# Patient Record
Sex: Female | Born: 1975
Health system: Southern US, Community
[De-identification: ages and names within clinical notes are randomized; demographics above are authoritative.]

## PROBLEM LIST (undated history)

## (undated) DIAGNOSIS — Z973 Presence of spectacles and contact lenses: Secondary | ICD-10-CM

## (undated) DIAGNOSIS — D219 Benign neoplasm of connective and other soft tissue, unspecified: Secondary | ICD-10-CM

## (undated) DIAGNOSIS — I499 Cardiac arrhythmia, unspecified: Secondary | ICD-10-CM

## (undated) DIAGNOSIS — N838 Other noninflammatory disorders of ovary, fallopian tube and broad ligament: Secondary | ICD-10-CM

## (undated) DIAGNOSIS — G43909 Migraine, unspecified, not intractable, without status migrainosus: Secondary | ICD-10-CM

## (undated) DIAGNOSIS — L309 Dermatitis, unspecified: Secondary | ICD-10-CM

## (undated) DIAGNOSIS — Z8489 Family history of other specified conditions: Secondary | ICD-10-CM

## (undated) DIAGNOSIS — N760 Acute vaginitis: Secondary | ICD-10-CM

## (undated) DIAGNOSIS — O210 Mild hyperemesis gravidarum: Secondary | ICD-10-CM

## (undated) DIAGNOSIS — R7303 Prediabetes: Secondary | ICD-10-CM

## (undated) DIAGNOSIS — B9689 Other specified bacterial agents as the cause of diseases classified elsewhere: Secondary | ICD-10-CM

## (undated) DIAGNOSIS — R102 Pelvic and perineal pain: Secondary | ICD-10-CM

## (undated) DIAGNOSIS — G43709 Chronic migraine without aura, not intractable, without status migrainosus: Secondary | ICD-10-CM

## (undated) DIAGNOSIS — T7840XA Allergy, unspecified, initial encounter: Secondary | ICD-10-CM

## (undated) DIAGNOSIS — R351 Nocturia: Secondary | ICD-10-CM

## (undated) DIAGNOSIS — G8929 Other chronic pain: Secondary | ICD-10-CM

## (undated) HISTORY — DX: Other specified bacterial agents as the cause of diseases classified elsewhere: B96.89

## (undated) HISTORY — DX: Pelvic and perineal pain: R10.2

## (undated) HISTORY — DX: Mild hyperemesis gravidarum: O21.0

## (undated) HISTORY — DX: Benign neoplasm of connective and other soft tissue, unspecified: D21.9

## (undated) HISTORY — DX: Cardiac arrhythmia, unspecified: I49.9

## (undated) HISTORY — PX: BREAST CYST EXCISION: SHX579

## (undated) HISTORY — PX: LAPAROSCOPIC ASSISTED VAGINAL HYSTERECTOMY: SHX5398

## (undated) HISTORY — PX: VAGINAL HYSTERECTOMY: SUR661

## (undated) HISTORY — PX: DIAGNOSTIC LAPAROSCOPY: SUR761

## (undated) HISTORY — DX: Acute vaginitis: N76.0

## (undated) HISTORY — DX: Other noninflammatory disorders of ovary, fallopian tube and broad ligament: N83.8

## (undated) HISTORY — PX: TUBAL LIGATION: SHX77

## (undated) HISTORY — DX: Chronic migraine without aura, not intractable, without status migrainosus: G43.709

## (undated) HISTORY — PX: LAPAROSCOPY: SHX197

## (undated) HISTORY — DX: Other chronic pain: G89.29

## (undated) HISTORY — DX: Allergy, unspecified, initial encounter: T78.40XA

---

## 1998-05-16 ENCOUNTER — Emergency Department (HOSPITAL_COMMUNITY): Admission: EM | Admit: 1998-05-16 | Discharge: 1998-05-16 | Payer: Self-pay | Admitting: Emergency Medicine

## 1998-08-24 ENCOUNTER — Observation Stay (HOSPITAL_COMMUNITY): Admission: AD | Admit: 1998-08-24 | Discharge: 1998-08-24 | Payer: Self-pay | Admitting: Obstetrics and Gynecology

## 1998-08-31 ENCOUNTER — Inpatient Hospital Stay (HOSPITAL_COMMUNITY): Admission: AD | Admit: 1998-08-31 | Discharge: 1998-09-01 | Payer: Self-pay | Admitting: Obstetrics and Gynecology

## 1998-09-10 ENCOUNTER — Inpatient Hospital Stay (HOSPITAL_COMMUNITY): Admission: AD | Admit: 1998-09-10 | Discharge: 1998-09-10 | Payer: Self-pay | Admitting: Obstetrics and Gynecology

## 1998-09-15 ENCOUNTER — Inpatient Hospital Stay (HOSPITAL_COMMUNITY): Admission: AD | Admit: 1998-09-15 | Discharge: 1998-09-15 | Payer: Self-pay | Admitting: Obstetrics and Gynecology

## 1998-09-17 ENCOUNTER — Encounter: Payer: Self-pay | Admitting: Obstetrics and Gynecology

## 1998-09-17 ENCOUNTER — Observation Stay (HOSPITAL_COMMUNITY): Admission: AD | Admit: 1998-09-17 | Discharge: 1998-09-18 | Payer: Self-pay | Admitting: Obstetrics and Gynecology

## 1998-09-21 ENCOUNTER — Emergency Department (HOSPITAL_COMMUNITY): Admission: EM | Admit: 1998-09-21 | Discharge: 1998-09-21 | Payer: Self-pay | Admitting: Emergency Medicine

## 1998-09-21 ENCOUNTER — Encounter: Payer: Self-pay | Admitting: Emergency Medicine

## 1999-02-07 ENCOUNTER — Inpatient Hospital Stay (HOSPITAL_COMMUNITY): Admission: AD | Admit: 1999-02-07 | Discharge: 1999-02-07 | Payer: Self-pay | Admitting: Obstetrics & Gynecology

## 1999-02-09 ENCOUNTER — Observation Stay (HOSPITAL_COMMUNITY): Admission: AD | Admit: 1999-02-09 | Discharge: 1999-02-10 | Payer: Self-pay | Admitting: Obstetrics and Gynecology

## 1999-02-10 ENCOUNTER — Encounter: Payer: Self-pay | Admitting: Obstetrics & Gynecology

## 1999-03-09 ENCOUNTER — Inpatient Hospital Stay (HOSPITAL_COMMUNITY): Admission: AD | Admit: 1999-03-09 | Discharge: 1999-03-09 | Payer: Self-pay | Admitting: *Deleted

## 1999-03-16 ENCOUNTER — Inpatient Hospital Stay (HOSPITAL_COMMUNITY): Admission: AD | Admit: 1999-03-16 | Discharge: 1999-03-16 | Payer: Self-pay | Admitting: Obstetrics and Gynecology

## 1999-03-17 ENCOUNTER — Observation Stay (HOSPITAL_COMMUNITY): Admission: AD | Admit: 1999-03-17 | Discharge: 1999-03-17 | Payer: Self-pay | Admitting: *Deleted

## 1999-03-20 ENCOUNTER — Inpatient Hospital Stay (HOSPITAL_COMMUNITY): Admission: AD | Admit: 1999-03-20 | Discharge: 1999-03-23 | Payer: Self-pay | Admitting: Obstetrics & Gynecology

## 2000-09-07 ENCOUNTER — Other Ambulatory Visit: Admission: RE | Admit: 2000-09-07 | Discharge: 2000-09-07 | Payer: Self-pay | Admitting: *Deleted

## 2000-12-04 ENCOUNTER — Encounter: Payer: Self-pay | Admitting: Family Medicine

## 2000-12-04 ENCOUNTER — Ambulatory Visit (HOSPITAL_COMMUNITY): Admission: RE | Admit: 2000-12-04 | Discharge: 2000-12-04 | Payer: Self-pay | Admitting: Family Medicine

## 2001-09-19 ENCOUNTER — Other Ambulatory Visit: Admission: RE | Admit: 2001-09-19 | Discharge: 2001-09-19 | Payer: Self-pay | Admitting: *Deleted

## 2002-09-23 ENCOUNTER — Other Ambulatory Visit: Admission: RE | Admit: 2002-09-23 | Discharge: 2002-09-23 | Payer: Self-pay | Admitting: *Deleted

## 2003-09-22 ENCOUNTER — Other Ambulatory Visit: Admission: RE | Admit: 2003-09-22 | Discharge: 2003-09-22 | Payer: Self-pay | Admitting: *Deleted

## 2003-11-04 ENCOUNTER — Encounter (INDEPENDENT_AMBULATORY_CARE_PROVIDER_SITE_OTHER): Payer: Self-pay

## 2003-11-04 ENCOUNTER — Observation Stay (HOSPITAL_COMMUNITY): Admission: RE | Admit: 2003-11-04 | Discharge: 2003-11-05 | Payer: Self-pay | Admitting: Obstetrics and Gynecology

## 2004-03-17 ENCOUNTER — Inpatient Hospital Stay (HOSPITAL_COMMUNITY): Admission: AD | Admit: 2004-03-17 | Discharge: 2004-03-17 | Payer: Self-pay | Admitting: Obstetrics and Gynecology

## 2004-03-17 ENCOUNTER — Ambulatory Visit (HOSPITAL_COMMUNITY): Admission: RE | Admit: 2004-03-17 | Discharge: 2004-03-17 | Payer: Self-pay | Admitting: Obstetrics and Gynecology

## 2004-03-21 ENCOUNTER — Ambulatory Visit (HOSPITAL_COMMUNITY): Admission: RE | Admit: 2004-03-21 | Discharge: 2004-03-21 | Payer: Self-pay | Admitting: Obstetrics and Gynecology

## 2004-09-06 ENCOUNTER — Inpatient Hospital Stay (HOSPITAL_COMMUNITY): Admission: AD | Admit: 2004-09-06 | Discharge: 2004-09-06 | Payer: Self-pay | Admitting: Obstetrics and Gynecology

## 2004-09-15 ENCOUNTER — Other Ambulatory Visit: Admission: RE | Admit: 2004-09-15 | Discharge: 2004-09-15 | Payer: Self-pay | Admitting: Obstetrics and Gynecology

## 2005-01-31 ENCOUNTER — Ambulatory Visit (HOSPITAL_COMMUNITY): Admission: RE | Admit: 2005-01-31 | Discharge: 2005-01-31 | Payer: Self-pay | Admitting: Family Medicine

## 2005-02-01 ENCOUNTER — Ambulatory Visit (HOSPITAL_COMMUNITY): Admission: RE | Admit: 2005-02-01 | Discharge: 2005-02-01 | Payer: Self-pay | Admitting: Obstetrics and Gynecology

## 2005-05-09 ENCOUNTER — Ambulatory Visit (HOSPITAL_COMMUNITY): Admission: RE | Admit: 2005-05-09 | Discharge: 2005-05-09 | Payer: Self-pay | Admitting: Obstetrics and Gynecology

## 2005-05-09 ENCOUNTER — Encounter (INDEPENDENT_AMBULATORY_CARE_PROVIDER_SITE_OTHER): Payer: Self-pay | Admitting: *Deleted

## 2005-12-05 ENCOUNTER — Other Ambulatory Visit: Admission: RE | Admit: 2005-12-05 | Discharge: 2005-12-05 | Payer: Self-pay | Admitting: Obstetrics and Gynecology

## 2009-04-29 ENCOUNTER — Encounter: Admission: RE | Admit: 2009-04-29 | Discharge: 2009-04-29 | Payer: Self-pay | Admitting: Obstetrics and Gynecology

## 2009-05-26 ENCOUNTER — Encounter: Admission: RE | Admit: 2009-05-26 | Discharge: 2009-05-26 | Payer: Self-pay | Admitting: Family Medicine

## 2010-05-25 ENCOUNTER — Encounter: Admission: RE | Admit: 2010-05-25 | Discharge: 2010-05-25 | Payer: Self-pay | Admitting: Obstetrics and Gynecology

## 2011-03-19 ENCOUNTER — Inpatient Hospital Stay (INDEPENDENT_AMBULATORY_CARE_PROVIDER_SITE_OTHER)
Admission: RE | Admit: 2011-03-19 | Discharge: 2011-03-19 | Disposition: A | Discharge status: Home / Self Care | Payer: 59 | Source: Ambulatory Visit | Attending: Family Medicine | Admitting: Family Medicine

## 2011-03-19 DIAGNOSIS — T7840XA Allergy, unspecified, initial encounter: Secondary | ICD-10-CM

## 2011-03-31 NOTE — Op Note (Signed)
NAMEONDREA, DOW              ACCOUNT NO.:  [id number]   MEDICAL RECORD NO.:  [id number]          PATIENT TYPE:  AMB   LOCATION:  SDC                           [facility]:  [facility]   PHYSICIAN:  Janine Limbo, M.D.DATE OF BIRTH:  [birth year]   DATE OF PROCEDURE:  05/09/2005  DATE OF DISCHARGE:                                 OPERATIVE REPORT   PREOPERATIVE DIAGNOSES:  1.  Chronic pelvic pain.  2.  Right lower quadrant pain.   POSTOPERATIVE DIAGNOSES:  1.  Chronic pelvic pain.  2.  Right lower quadrant pain.  3.  The pelvic adhesions.  4.  Bilateral hydrosalpinges.  5.  Endometriosis.   PROCEDURES:  1.  Diagnostic laparoscopy.  2.  Laparoscopic lysis of adhesions.  3.  Laparoscopic pelvic biopsies.  4.  Laparoscopic bilateral salpingectomies.  5.  Laparoscopic suspension of the right ovary.   SURGEON:  Janine Limbo, M.D.   FIRST ASSISTANT:  None.   ANESTHETIC:  general.   DISPOSITION:  Ms. Montelongo is a 35 year old female, para 2-0-0-2, who  presents with the above-mentioned diagnoses.  The patient has had a laparoscopic vaginal hysterectomy as well as a  diagnostic laparoscopy.  She continues to have discomfort.  She has been  seen by a pelvic pain specialist from the [place] [facility] [place] at  [facility], and they have recommended that we suspend her right ovary.  The  patient specifically complains of right lower quadrant pain.  An ultrasound  of her pelvis is normal except for what appears to be bilateral  hydrosalpinges.  The patient understands the indications for her diagnostic  laparoscopy.  She understands that there is no guarantee that we will be  able to eliminate all of her pain.  The patient has asked that if we find  dense adhesions on her right ovary that we proceed with right oophorectomy.  Otherwise, she would like to keep her right ovary.  The patient accepts the  risks of, but not limited to, anesthetic complications, bleeding,  infections, and possible damage to the surrounding organs.   FINDINGS:  The uterus was surgically absent.  The right ovary appeared to be  normal.  It was present in the posterior cul-de-sac to the right.  The left  ovary appeared normal except for some adhesions between the left pelvic  sidewall and the left ovary.  Both ovaries contained hydrosalpinges.  There  were adhesions present between the left fallopian tube, the left ovary, and  the left pelvic sidewall.  There was a hyperpigmented area in the left  anterior cul-de-sac that measured approximately 0.3 cm in size, and this was  consistent with endometriosis.  There were planned bland-appearing,  thickened areas where the round ligaments should be bilaterally.  This was  thought to represent scar tissue.  The appendix, the liver, and the  remainder of the bowel appeared normal.   PROCEDURE:  The patient was taken to the operating room, where a general  anesthetic was given.  The patient's abdomen, perineum, and vagina were  prepped with multiple layers of Betadine.  A Foley  catheter was placed in  the bladder.  Examination under anesthesia was performed.  A padded sponge  stick was placed in the vagina.  The patient was then sterilely draped.  The  subumbilical area was injected with 5 mL of 0.5% Marcaine with epinephrine.  An incision was made and we removed the old scar from her subumbilical area.  The incision was then extended through the subcutaneous tissue, the fascia,  and the anterior peritoneum.  Care was taken not to damage any of the vital  underlying structures.  The Hassan cannula was sutured into place.  A  pneumoperitoneum was obtained.  The pelvis was inspected with findings as  mentioned above.  Two suprapubic areas were injected with a total of 5 mL of  0.5% Marcaine with epinephrine.  Two 5 mm trocars were placed in the lower  abdomen under direct visualization.  Pictures were then taken of the  patient's pelvis  and abdominal structures.  We injected the hyperpigmented  area and the pelvis with 5 mL of 0.5% Marcaine with epinephrine.  The tan-  appearing area present at both round ligaments was then injected with 2 mL  of 0.5% Marcaine with epinephrine.  Biopsies were obtained of both of these areas.  Hemostasis was achieved  using the bipolar cautery.  Care was taken not to damage any of the vital underlying structures.  We  then isolated the hydrosalpinx on the right fallopian tube.  Using the  bipolar cautery, we cauterized the mesosalpinx.  The hydrosalpinx and the  remainder of the fallopian tube was then sharply excised.  Hemostasis was  adequate.  We placed a 5-0 suture of silk into the abdomen, and we attempted  to suspend the right ovary from the right pelvic sidewall using a 5-0 silk  suture.  This was not successful.  We then used two 2-0 Vicryl Endoloops to  suspend the right ovary to the right pelvic sidewall, and we did incorporate  the silk suture.  Pictures were taken.  Hemostasis was adequate.  The pelvis  was vigorously irrigated.  We then lysed the adhesions between the left  fallopian tube and the left pelvic sidewall.  This isolated the left  hydrosalpinx.  We then cauterized the base of the mesosalpinx using the  bipolar cautery.  The left fallopian tube was then sharply excised.  Hemostasis was noted to be adequate throughout.  The pelvis was vigorously  irrigated.  Again care was taken not to damage any of the vital underlying  structures.  At this point we felt that we should terminate our procedure.  The pneumoperitoneum was allowed to escape.  The trocars were removed under  direct visualization.  Again hemostasis was adequate.  The subumbilical  incision was closed using figure-of-eight sutures of 2-0 Vicryl.  The  subcutaneous layer was closed using interrupted sutures of 2-0 Vicryl.  All  skin incisions were closed using subcuticular sutures of 4-0 Monocryl. Sponge,  needle, and instrument counts were correct on two occasions.  The estimated blood loss for the procedure was 10 mL.  The patient tolerated  her procedure well.  The patient was awakened from her anesthetic and taken  to the recovery room in stable condition.  Sponge, needle, and instrument  counts were noted to be correct.  The patient was noted to drain clear  yellow urine.   FOLLOW-UP INSTRUCTIONS:  The patient was given a prescription for Vicodin,  and she will take one or two tablets every four  hours as needed for pain.  She will return to see Dr. Stefano Gaul in two to three weeks for follow-up  examination.  She was given a copy of the postoperative instruction sheet as prepared by  the [facility] for patients who have undergone a  diagnostic laparoscopy.       AVS/MEDQ  D:  05/09/2005  T:  05/09/2005  Job:  [id number]   cc:   [name], M.D.  [address]  [place]  [place] [address]  Fax: [phone]   Elana Alm. Nicholos Johns, M.D.  [address]  [place]  [place] [address]  Fax: [phone]   Saralyn Pilar  [facility]  [address]  [place] [address]  Fax: [phone]

## 2011-03-31 NOTE — H&P (Signed)
NAMEAINO, HECKERT              ACCOUNT NO.:  [id number]   MEDICAL RECORD NO.:  [id number]          PATIENT TYPE:  AMB   LOCATION:  SDC                           [facility]   PHYSICIAN:  Janine Limbo, M.D.DATE OF BIRTH:  10/10/1976   DATE OF ADMISSION:  05/09/2005  DATE OF DISCHARGE:                                HISTORY & PHYSICAL   HISTORY OF PRESENT ILLNESS:  Ms. Hanks is a 35 year old female, para 2-0-0-  2, who presents for diagnostic laparoscopy. The patient has a history of  chronic pelvic pain and she has pain particularly in the right lower  quadrant. The patient is status post laparoscopically assisted vaginal  hysterectomy. She is also status post diagnostic laparoscopy in May of 2005.  Operative findings including bilateral hydrosalpinges but her ovaries  appeared grossly normal. There were some adhesions present.   The patient had been seen by a urologist and he does not think that she has  a urologic etiology for her discomfort. She has been seen by Dr. Saralyn Pilar  at the [place] [facility] and he believes that she is  having pain from her right ovary. He feels that the ovary is present in the  cul-de-sac at the apex of the vagina. He feels that suspension of the right  ovary may relieve her discomfort. The patient has a past history of  Chlamydia that was appropriately treated. Her most recent Pap smear was  within normal limits. The patient denies fever and chills.   OBSTETRICAL HISTORY:  The patient had a vaginal delivery at term in 1997. In  2000, she had a vaginal delivery at term.   PAST MEDICAL HISTORY:  The patient has a past history of migraine headaches.  She denies hypertension and diabetes.   ALLERGIES:  No known drug allergies.   SOCIAL HISTORY:  The patient is married and she denies cigarette use,  alcohol use, and recreational drug use.   REVIEW OF SYMPTOMS:  Noncontributory.   FAMILY HISTORY:  The patient's  mother had breast cancer at age 7. Her  maternal aunts had hypertension.   PHYSICAL EXAMINATION:  VITAL SIGNS:  Weight is 145 pounds. Height is 5 feet  9 inches.  HEENT:  Within normal limits.  CHEST:  Clear.  HEART:  Regular rate and rhythm.  BREASTS:  Without masses.  ABDOMEN:  Nontender.  EXTREMITIES:  Within normal limits.  NEUROLOGICAL:  Grossly normal.  PELVIC:  External genitalia is normal. The vagina is normal. The cervix is  absent. The uterus is absent.  ADNEXA:  No masses are appreciated. There is tenderness on the right.  Rectovaginal exam confirms.   ASSESSMENT:  1.  Right lower quadrant pain.  2.  Chronic pelvic pain.   PLAN:  The patient will undergo a diagnostic laparoscopy. The patient has  said that if we are relatively assured that her pain will be resolved with  suspension of her right ovary then she would like for [place] to proceed with  that. On the other hand, if it appears that oophorectomy on the right will  more than likely relieve her discomfort, then she does say that we have  permission to proceed with right salpingo-oophorectomy. The patient  understands the indications for her procedure and she accepts the risks of,  but not limited to anesthetic complications, bleeding, infections, and  possible damage to the surrounding organs.       AVS/MEDQ  D:  05/08/2005  T:  05/08/2005  Job:  [id number]   cc:   [name], M.D.  [address]  [place]  [place] [address]  Fax: [phone]   Elana Alm. Nicholos Johns, M.D.  [address]  [place]  [place] [address]  Fax: [phone]

## 2011-03-31 NOTE — Op Note (Signed)
NAMESCOTTLYN, Rhonda [name]                        ACCOUNT NO.:  [id number]   MEDICAL RECORD NO.:  [id number]                   PATIENT TYPE:  AMB   LOCATION:  SDC                                  [facility]   PHYSICIAN:  Janine Limbo, M.D.            DATE OF BIRTH:  04/09/1976   DATE OF PROCEDURE:  03/21/2004  DATE OF DISCHARGE:                                 OPERATIVE REPORT   PREOPERATIVE DIAGNOSIS:  Pelvic pain.   POSTOPERATIVE DIAGNOSES:  1. Pelvic pain.  2. Pelvic adhesions.   PROCEDURES:  1. Diagnostic laparoscopy.  2. Laparoscopic lysis of adhesions.   SURGEON:  Janine Limbo, M.D.   ANESTHESIA:  General anesthesia.   INDICATIONS FOR PROCEDURE:  Rhonda [name] is a 35 year old female, para 2-0-0-  2, who presents for diagnostic laparoscopy because of ongoing pelvic pain.  The patient had a laparoscopically assisted vaginal hysterectomy in December  of 2004.  She has also had a prior diagnostic laparoscopy.  The patient had  an ultrasound performed recently that showed a fluid filled structure in the  right pelvis consistent with a hydrosalpinx.  The patient's pain is mainly  on the left on this occasion.  The patient understands the indications for  her procedure and she accepts the risks of, but not limited to, anesthetic  complications, bleeding, infections, and possible damage to surrounding  organs.  The patient has elected not to have her left ovary removed unless  absolutely necessary.   FINDINGS:  The uterus was surgically absent.  There were dense adhesions  between the large bowel and the apex of the vaginal cuff.  There were  moderate adhesions between the small-bowel and the left sidewall just above  the left ovary.  Both fallopian tubes were slightly edematous.  The ovaries  appeared normal bilaterally.  The appendix appeared normal.  The upper  abdomen appeared normal.  There was no evidence of endometriosis in the  pelvis.   DESCRIPTION OF  PROCEDURE:  The patient was taken to the operating room where  a general anesthetic was given.  The patient's abdomen, perineum, and vagina  were prepped with multiple layers of Betadine.  Examination under anesthesia  was performed.  A Foley catheter was placed in the bladder.  A padded sponge  stick was placed in the vagina.  The patient was then sterilely draped.  The  subumbilical area was injected with 5 mL of 0.5% Marcaine with epinephrine.  An incision was made in the subumbilical area removing the old scar.  The  incision was extended sharply through the subcutaneous tissue, the fascia,  and the anterior peritoneum.  The Hasson cannula was sutured into place.  The pelvis was visualized and then a pneumoperitoneum was obtained.  The  pelvis was carefully inspected.  Two areas in the suprapubic region were  injected with a total of 4 mL of 0.5% Marcaine with epinephrine.  Two small  incisions were made and two 5 mm trocars were placed in the lower abdomen,  both under direct visualization.  Pictures were taken of the patient's  pelvic anatomy.  Hydrodissection was then performed with the scar tissue  attached to the apex of the vagina.  We carefully dissected the bowel off  the apex of the vagina.  There was no evidence of damage to the bowel.  Hemostasis was achieved on the vaginal apex using the bipolar cautery.  The  adhesions between the bowel and the left pelvic sidewall were then sharply  removed.  Again, care was taken not to damage the bowel and also not to  damage the vital underlying structures.  Hemostasis was noted to be  adequate.  The pelvis was vigorously irrigated.  The irrigation fluid was  aspirated.  Again, the bowel was carefully inspected and there was no  evidence of damage from our dissection and there was no evidence of trocar  damage.  Again, pictures were taken of patient's pelvic anatomy.  At this  point, we felt that our procedure was complete.  The patient  was given 30 mg  of IV Toradol.  The pneumoperitoneum was allowed to escape.  All instruments  were removed.  The subumbilical incision was closed using a running suture  of 0 Vicryl.  The subcutaneous layer was closed using 0 Vicryl.  The skin  was reapproximated using a subcuticular suture of 4-0 Vicryl.  The two  suprapubic incisions were closed using 4-0 Vicryl.  Sponge, needle and  instrument counts were correct.   ESTIMATED BLOOD LOSS:  10 mL.   The patient tolerated the procedure well.  She was awakened from her  anesthetic and taken to the recovery room in stable condition.  The patient  was noted to drain clear, yellow urine.   FOLLOW UP INSTRUCTIONS:  The patient was given a prescription for Vicodin  and she will take one or two tablets every four hours as needed for pain.  She will return to see Dr. Stefano Gaul in two to three weeks for follow-up  examination.  She was given a copy of the postoperative instruction sheet as  prepared by the [facility] for patients who have  undergone laparoscopy.  The patient knows that she should call for questions  or concerns.  She was given a note to return to work on Mar 28, 2004.                                               Janine Limbo, M.D.    AVS/MEDQ  D:  03/21/2004  T:  03/21/2004  Job:  [id number]

## 2011-03-31 NOTE — Discharge Summary (Signed)
NAMEHEYLEE, TANT                        ACCOUNT NO.:  [id number]   MEDICAL RECORD NO.:  [id number]                   PATIENT TYPE:  OBV   LOCATION:  9105                                 [facility]ILITY[facility]   PHYSICIAN:  Janine Limbo, M.D.            DATE OF BIRTH:  May 05, 1976   DATE OF ADMISSION:  11/04/2003  DATE OF DISCHARGE:  11/05/2003                                 DISCHARGE SUMMARY   DISCHARGE DIAGNOSES:  1. Dysmenorrhea.  2. Menorrhagia.  3. Dyspareunia.  4. Fibroid uterus.  5. Anemia.  6. Cystic bladder mass.   OPERATION:  On the date of admission, the patient underwent a  laparoscopically-assisted vaginal hysterectomy, along with a cystogram,  hydatid cystectomy, along with peritoneal and left ovarian biopsy,  tolerating all procedures well.  The patient was found to have an upper  limits of normal size uterus with normal appearing right tube and left tube,  both of which have been previously ligated; normal-appearing right ovary and  left ovary, with a questionable redundant __________ ovarian tissue which  was biopsied.  The patient had a peritoneal window in the posterior cul-de-  sac, and a left hydatid Morgagni.  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   HISTORY OF PRESENT ILLNESS:  Ms. Granato is a 35 year old female, para 2-0-0-  2, who presents for a laparoscopically-assisted vaginal hysterectomy because  of dysmenorrhea and menorrhagia.  Please see patient's dictated history and  physical examination for details.   PREOPERATIVE PHYSICAL EXAMINATION:  VITAL SIGNS:  Weight 144 pounds.  GENERAL:  Within normal limits.  PELVIC:  External genitalia is normal.  Vaginal is normal.  Cervix is  nontender.  The uterus is upper limits of normal size.  Adnexa without  masses.  RECTOVAGINAL EXAM:  Confirms.   HOSPITAL COURSE:  On the date of admission, the patient underwent the  aforementioned procedures, tolerating them all  well.  The postoperative  course was unremarkable, with the patient resuming bowel and bladder  function by postoperative day #1, and therefore deemed ready for discharge  home.  Postoperative hemoglobin was 11.6 (preoperative hemoglobin 11.9).   DISCHARGE MEDICATIONS:  1. Vicodin 1-2 tablets q.4-6h. as needed for pain.  2. Ibuprofen 600 mg one tablet with food q.6h. for 5 days, then as needed     for pain.  3. Phenergan 25 mg one tablet q.6h. for nausea.  4. Colace 100 mg one tablet twice daily until bowel movements are regular.   FOLLOW UP:  The patient is scheduled for a 6 week postoperative exam with  Dr. Stefano Gaul on December 16, 2003 at 1:30 p.m.   DISCHARGE INSTRUCTIONS:  1. The patient was given a copy of [facility] postoperative     instruction sheet.  2. She was further advised to avoid driving for 2 weeks, heavy lifting for 4     weeks, and intercourse  for 6 weeks.   DIET:  Without restriction.   FINAL PATHOLOGY:  1. Fallopian tube - hydatid cyst of Morgagni:  Benign paratubal type cyst.  2. Ovary - biopsy/wedge resection, left:  Benign ovarian cortical stroma     with no pathologic abnormalities identified.  3. Peritoneum, biopsy:  Fibrous adhesions.  4. Uterus and cervix:  Cervix with no pathologic abnormalities identified.     Benign proliferative endometrium.  Benign 0.6 cm leiomyoma.     [place] J. Adline Peals.                    Janine Limbo, M.D.    EJP/MEDQ  D:  11/26/2003  T:  11/26/2003  Job:  [id number]

## 2011-03-31 NOTE — H&P (Signed)
NAMESAMERA, Rhonda [name]                        ACCOUNT NO.:  [id number]   MEDICAL RECORD NO.:  [id number]                   PATIENT TYPE:  OBV   LOCATION:  NA                                   [facility]   PHYSICIAN:  Janine Limbo, M.D.            DATE OF BIRTH:  03/19/1976   DATE OF ADMISSION:  11/04/2003  DATE OF DISCHARGE:                                HISTORY & PHYSICAL   HISTORY OF PRESENT ILLNESS:  The patient is [name] 35 year old female, para 2-0-0-  2, who presents for [name] laparoscopically-assisted vaginal hysterectomy.  The  patient has been seen at the [facility] and [facility] division of  [facility].  She complains of dysmenorrhea and  menorrhagia.  She has had [name] D&C in the past with benign endometrium found.  Her most recent Pap smear was within normal limits.  Her GC and Chlamydia  cultures were negative.  Oral contraceptives and pain medications have not  relieved her discomfort.  She is status post tubal ligation.  She wishes to  proceed with definitive therapy at this time.  She was treated with  antibiotics and this also did not relieve her discomfort.  An ultrasound  showed fibroids.  The patient does have [name] past history of Chlamydia that was  appropriately treated.   PAST OBSTETRICAL HISTORY:  In 1997, the patient had [name] vaginal delivery at  term of [name] 5 pound 11 ounce female infant.  In 2000, the patient had [name]  vaginal delivery of [name] 6 pound 6 ounce female infant at term.  She also had [name]  postpartum tubal ligation.   PAST MEDICAL HISTORY:  The patient has [name] past history of migraine headaches.  She denies hypertension and diabetes.  She reports that she is not using any  medications currently except for pain medicines.   ALLERGIES:  No known drug allergies.   SOCIAL HISTORY:  The patient denies cigarette use, alcohol use, and  recreational drug use.   REVIEW OF SYSTEMS:  The patient complains of dyspareunia.   FAMILY HISTORY:  The  patient's mother had breast cancer at age 65.   PHYSICAL EXAMINATION:  VITAL SIGNS:  Weight 144 pounds.  HEENT:  Within normal limits.  CHEST:  Clear.  HEART:  Regular rate and rhythm.  BREASTS:  Without masses.  ABDOMEN:  Nontender.  EXTREMITIES:  Within normal limits.  NEUROLOGY:  Grossly normal.  PELVIC:  External genitalia is normal.  The vagina is normal.  The cervix is  nontender.  The uterus is upper limits of normal size.  Adnexa no masses.  RECTOVAGINAL:  Confirms.   ASSESSMENT:  1. Dysmenorrhea.  2. Menorrhagia.  3. Dyspareunia.  4. Probable fibroids.  5. Past history of Chlamydia.   PLAN:  The patient will undergo [name] laparoscopically-assisted vaginal  hysterectomy.  She understands the indications for her procedure and she  accepts the risks of,  but not limited to, anesthetic complications,  bleeding, infections, and possible damage to the surrounding organs.                                               Janine Limbo, M.D.    AVS/MEDQ  D:  11/03/2003  T:  11/03/2003  Job:  [id number]

## 2011-03-31 NOTE — Op Note (Signed)
Rhonda [name], Rhonda [name]                        ACCOUNT NO.:  [id number]   MEDICAL RECORD NO.:  [id number]                   PATIENT TYPE:  OBV   LOCATION:  9199                                 FACILITY:  [facility]   PHYSICIAN:  Janine Limbo, M.D.            DATE OF BIRTH:  Jun 27, 1976   DATE OF PROCEDURE:  11/04/2003  DATE OF DISCHARGE:                                 OPERATIVE REPORT   PREOPERATIVE DIAGNOSES:  1. Dysmenorrhea.  2. Menorrhagia.  3. Dyspareunia.  4. Anemia (hemoglobin 11.9).  5. Probable fibroid.   POSTOPERATIVE DIAGNOSES:  1. Dysmenorrhea.  2. Menorrhagia.  3. Dyspareunia.  4. Anemia (hemoglobin 11.9).  5. Probable fibroid.  6. Cystic bladder mass, rule out urachal cyst.  7. Peritoneal window, rule out endometriosis.  8. Hydatid cyst of Morgagni.   PROCEDURES:  1. Laparoscopically-assisted vaginal hysterectomy.  2. Cystogram.  3. Laparoscopic pelvic biopsies, including removal of hydatid cyst.   SURGEON:  Janine Limbo, M.D.   FIRST ASSISTANT:  Elmira J. Adline Peals.   ANESTHESIA:  General.   DISPOSITION:  Rhonda [name] is a 35 year old female, para 2-0-0-2, who  presents with the above-mentioned diagnoses.  She understands the  indications for her procedure and she accepts the risks of, but not limited  to, anesthetic complications, bleeding, infections, and possible damage to  the surrounding organs.   FINDINGS:  The uterus was upper limits normal size.  There was a normal  appearance to the fallopian tubes except for the defects from the prior  tubal ligation.  The ovaries appeared normal except that on the left there  was a 0.5 cm protrusion on the left ovary.  Biopsies were obtained.  There  was a 1 cm hydatid cyst that had detached from the fallopian tube on the  left and was attached to the left posterior peritoneum.  There was a 1-2 cm  cystic mass present anterior to the uterus and adhered to the anterior  uterus and adhered to the  posterior bladder.  Rhonda [name], M.D.  (urologist) was consulted and a urachal cyst was questioned.  A cystogram  was performed at the end of our procedure, and there was no evidence of  extravasation from the bladder and no evidence of a defect in the bladder.  The mass was not present after the vaginal hysterectomy.  There was a  peritoneal window in the left posterior cul-de-sac.  There was no definitive  evidence of endometriosis, however.  The appendix, the liver, the bowel, and  the remainder of the pelvic structures appeared normal.   DESCRIPTION OF PROCEDURE:  The patient was taken to the operating room,  where a general anesthetic was given.  The patient's abdomen, perineum, and  vagina were prepped with multiple layers of Betadine.  A Foley catheter was  placed in the bladder.  Examination under anesthesia was performed.  The  patient was sterilely draped.  The subumbilical  area was injected with 5 mL  of 0.5% Marcaine with epinephrine.  A subumbilical incision was made and the  incision was extended through the subcutaneous tissue, the fascia, and the  anterior peritoneum.  The Hasson cannula was sutured into place.  A  pneumoperitoneum was obtained.  The laparoscope was inserted.  The pelvic  structures were visualized.  The suprapubic area was injected with 3 mL of  0.5% Marcaine with epinephrine and a 5 mm trocar was placed in the lower  abdomen under direct visualization.  Pictures were taken of the patient's  pelvic anatomy.  The mass at the anterior uterus and at the base of the  bladder was then inspected.  We consulted a urologist, and a urachal cyst  was mentioned as a possibility.  The hydatid cyst in the left posterior cul-  de-sac was then removed without difficulty.  The ureter was identified and  was felt to be away from our surgical areas.  The peritoneal window in the  left posterior cul-de-sac was injected with normal saline and a biopsy was  obtained from  the peritoneal window.  Hemostasis was achieved using the  bipolar cautery.  Care was taken not to damage any of the vital underlying  structures.  The round ligaments were cauterized bilaterally and cut.  The  bladder flap was developed anteriorly.  At this point we felt we were ready  to proceed with the vaginal portion of our procedure.  The patient's cervix  was injected with a diluted solution of Pitressin and saline.  A  circumferential incision was made around the cervix and the mucosa was  advanced anteriorly and posteriorly.  The posterior cul-de-sac and the  anterior cul-de-sac were sharply entered.  A running suture was placed along  the posterior cul-de-sac for hemostasis.  Alternating from left to right the  uterosacral ligaments, paracervical tissues, parametrial tissues, and  uterine arteries were clamped, cut, sutured, and tied securely.  The uterus  was inverted through the posterior colpotomy.  The remainder of the upper  pedicles were clamped and cut and the uterus was removed from the operative  field.  The estimated weight was less than 250 g.  The upper pedicles were  then free-tied and suture ligated.  Hemostasis was noted to be adequate.  The sutures attached to the uterosacral ligaments were brought out through  the vaginal angles and tied securely.  A McCall culdoplasty suture was  placed in the posterior cul-de-sac incorporating the uterosacral ligaments  bilaterally and the posterior peritoneum.  A final check was made for  hemostasis and again hemostasis was thought to be adequate.  The vaginal  cuff was closed using figure-of-eight sutures incorporating the anterior  vaginal mucosa, the anterior peritoneum, the posterior peritoneum, and the  posterior vaginal mucosa.  The McCall culdoplasty suture was tied securely  and the apex of the vagina was noted to elevate into the midpelvis.  The operator then changed gloves.  The pneumoperitoneum was re-established.   The  pelvis was carefully inspected and again hemostasis was confirmed.  There  was no evidence of damage to the ureters, the bowel, or any other vital  structures in the pelvis.  The cystic mass that was present earlier was no  longer visible.  The urology consultor recommended a cystogram to document  that there was no evidence of extravasation or communication with the  bladder.  The pelvis was vigorously irrigated and the irrigation fluid was  removed.  The pneumoperitoneum  was allowed to escape.  All instruments were  removed.  The fascia at the subumbilical incision was closed using figure-of-  eight sutures.  The skin was reapproximated using 3-0 Vicryl.  The  suprapubic incision was closed using 3-0 Vicryl.  Contrast dye was then  placed into the bladder through the Foley catheter.  A series of x-rays was  obtained and the bladder was noted to fill easily.  The contour of the  bladder appeared completely normal.  There was no evidence of extravasation  and no evidence of pathology to the bladder.  The contrast material was  allowed to drain.  The patient was awakened from her anesthetic without  difficulty.  She was taken to the recovery room in stable condition.  Vicryl  0 was the suture material used throughout the procedure.  The estimated  blood loss for the  procedure was 150 mL.  The estimated urine output was 350 mL.  The patient  was noted to have clear, yellow urine.  A catheterized urinalysis was  obtained during the procedure, and there was no evidence of red blood cells  or infection.                                               Janine Limbo, M.D.    AVS/MEDQ  D:  11/04/2003  T:  11/05/2003  Job:  [id number]

## 2011-03-31 NOTE — H&P (Signed)
NAME:  Rhonda [name], Rhonda [name]                        ACCOUNT NO.:  [id number]   MEDICAL RECORD NO.:  [id number]                   PATIENT TYPE:  OUT   LOCATION:  ULT                                  [facility]ILITY:  [facility]   PHYSICIAN:  Janine Limbo, M.D.            DATE OF BIRTH:  1976/01/27   DATE OF ADMISSION:  03/17/2004  DATE OF DISCHARGE:                                HISTORY & PHYSICAL   HISTORY OF PRESENT ILLNESS:  Rhonda [name] is a 35 year old female, para 2-0-0-  2, who presents to the maternities admission area at the [facility] of  [facility] complaining of left lower quadrant abdominal pain that she rates  at 9/10 and then sometimes a 10/10.  The patient reports that she is having  difficulty working and walking.  Pain medications have not relieved her  discomfort.  The patient had an ultrasound performed that showed fluid in  the right fallopian tube.  No other masses or collections were appreciated.  The patient is status post laparoscopically assisted vaginal hysterectomy  from November 04, 2003.  Operative findings at the time included an upper  limits normal size uterus and a hydatid cyst of Morgagni.  The patient was  thought to have a mass in her bladder, but followup did not show any masses.  The patient is status post tubal ligation.  The patient has a past history  of chlamydia that was appropriately treated.  The patient denies GI and GU  symptoms.  She reports that she has had fever, but denies chills.   OBSTETRICAL HISTORY:  In 1997, the patient had a vaginal delivery at term of  a 5 pound 11 ounce female infant.  In 2000, the patient had a vaginal  delivery of a 6 pound 6 ounce female infant at term.   PAST MEDICAL HISTORY:  The patient has a past history of migraine headaches.  She denies hypertension and diabetes.   DRUG ALLERGIES:  No known drug allergies.   SOCIAL HISTORY:  The patient is married and she denies cigarette use,  alcohol use, and  recreational drug use.   REVIEW OF SYSTEMS:  Noncontributory.   FAMILY HISTORY:  The patient's mother had breast cancer at age 61.  Her  maternal aunts had hypertension.   PHYSICAL EXAMINATION:  VITAL SIGNS:  The temperature is 99.1 degrees, pulse  72, respirations 18, and blood pressure 123/69.  HEENT:  Within normal limits.  CHEST:  Clear.  HEART:  Regular rate and rhythm with a grade 1/6 systolic ejection murmur.  ABDOMEN:  Soft and nontender.  No masses are appreciated.  EXTREMITIES:  Grossly normal.  NEUROLOGIC:  Grossly normal.  PELVIC:  External genitalia is normal.  The vagina is normal, although the  vagina is tender posteriorly.  The cervix is absent.  The uterus is absent.  Adnexa:  No masses are appreciated, though slightly tender on the left.   ASSESSMENT:  Severe abdominal pain of uncertain etiology.   PLAN:  The patient wants to proceed with diagnostic laparoscopy.  The  patient was given Toradol in the maternity admissions area and her pain was  not significantly relieved.  She understands that we cannot guarantee relief  of her discomfort with laparoscopy.  She further understands the risks  associated with her procedure, which include, but are not limited to  anesthetic complications, bleeding, infections, and possible damage to the  surrounding organs.  Because the patient does have tenderness posteriorly  and because there is a large amount of stool in the bowel, we will give the  patient a soap suds enema to see if her discomfort is relieved.                                               Janine Limbo, M.D.    AVS/MEDQ  D:  03/17/2004  T:  03/17/2004  Job:  [id number]

## 2012-06-11 ENCOUNTER — Ambulatory Visit: Payer: Self-pay | Admitting: Obstetrics and Gynecology

## 2012-07-24 ENCOUNTER — Encounter: Payer: Self-pay | Admitting: Obstetrics and Gynecology

## 2012-07-24 ENCOUNTER — Ambulatory Visit (INDEPENDENT_AMBULATORY_CARE_PROVIDER_SITE_OTHER): Payer: 59 | Admitting: Obstetrics and Gynecology

## 2012-07-24 VITALS — BP 112/64 | Resp 14 | Ht 68.5 in | Wt 165.0 lb

## 2012-07-24 DIAGNOSIS — Z124 Encounter for screening for malignant neoplasm of cervix: Secondary | ICD-10-CM

## 2012-07-24 DIAGNOSIS — E049 Nontoxic goiter, unspecified: Secondary | ICD-10-CM

## 2012-07-24 DIAGNOSIS — N63 Unspecified lump in unspecified breast: Secondary | ICD-10-CM

## 2012-07-24 DIAGNOSIS — N6320 Unspecified lump in the left breast, unspecified quadrant: Secondary | ICD-10-CM

## 2012-07-24 DIAGNOSIS — N6325 Unspecified lump in the left breast, overlapping quadrants: Secondary | ICD-10-CM | POA: Insufficient documentation

## 2012-07-24 LAB — THYROID PANEL WITH TSH
Free Thyroxine Index: 2.2 (ref 1.0–3.9)
T3 Uptake: 36.5 % (ref 22.5–37.0)
T4, Total: 5.9 ug/dL (ref 5.0–12.5)
TSH: 0.943 u[IU]/mL (ref 0.350–4.500)

## 2012-07-24 NOTE — Progress Notes (Signed)
The patient is not taking hormone replacement therapy The patient  is not taking a Calcium supplement. Post-menopausal bleeding:yes:    Last Pap: approximate date 2009 and was normal  Last mammogram: approximate date 2011 and was normal  Last DEXA scan :  n/a Last colonoscopy:n/a  Urinary symptoms: none Normal bowel movements: Yes Reports abuse at home: No:

## 2012-07-24 NOTE — Progress Notes (Signed)
Subjective:    Rhonda [name] is a 36 y.o. female, G2P2, who presents for an annual exam.   Patient reports:  Doing well.  May have BV.      History   Social History  . Marital Status: Single    Spouse Name: N/A    Number of Children: N/A  . Years of Education: N/A   Social History Main Topics  . Smoking status: Never Smoker   . Smokeless tobacco: Never Used  . Alcohol Use: Yes  . Drug Use: No  . Sexually Active: Yes    Birth Control/ Protection: Surgical     Hysterectomy   Other Topics Concern  . None   Social History Narrative  . None    Menstrual cycle:   LMP: No LMP recorded. Patient has had a hysterectomy.           Cycle: S/p hysterectomy  The following portions of the patient's history were reviewed and updated as appropriate: allergies, current medications, past family history, past medical history, past social history, past surgical history and problem list.  Review of Systems Pertinent items are noted in HPI. Breast:Negative for breast lump,nipple discharge or nipple retraction Gastrointestinal: Negative for abdominal pain, change in bowel habits or rectal bleeding Urinary:negative   Objective:    BP 112/64  Resp 14  Ht 5' 8.5" (1.74 m)  Wt 165 lb (74.844 kg)  BMI 24.72 kg/m2    Weight:  Wt Readings from Last 1 Encounters:  07/24/12 165 lb (74.844 kg)          BMI: Body mass index is 24.72 kg/(m^2).  General Appearance: Alert, appropriate appearance for age. No acute distress HEENT: Grossly normal Neck / Thyroid: ? Nodules on thyroid bilaterally vs general thyroid enlargement Lungs: clear to auscultation bilaterally Back: No CVA tenderness Breast Exam: Discrete mass--approx 1/2 cm mobile mass in left breast at 3 o'clock position.  Glandular tissue present bilaterally.  Cardiovascular: Regular rate and rhythm. S1, S2, no murmur Gastrointestinal: Soft, non-tender, no masses or organomegaly Pelvic Exam: Uterus and cervix absent.  Adnexal area  without masses or tenderness to palpation. Rectovaginal: normal rectal, no masses Lymphatic Exam: Non-palpable nodes in neck, clavicular, axillary, or inguinal regions  Skin: no rash or abnormalities Neurologic: Normal gait and speech, no tremor  Psychiatric: Alert and oriented, appropriate affect.   Wet Prep:positive clue cells and positive whiff test Urinalysis:not applicable UPT: Not done   Assessment:    Enlarged thyroid vs nodules  Mass in left breast BV   Plan:    [facility] with specific request for evaluation of mass in left breast Pap:  Done STD screening: declined Contraception:NA, due to hysterectomy Other:  Rx MTZ 500 mg po BID x 7 days. Check thyroid panel. May need referral back to primary, Dr. Tiburcio Pea, at [facility] for further w/u of ? Thyroid nodules.      Nyra Capes, [place]

## 2012-07-26 LAB — PAP IG W/ RFLX HPV ASCU

## 2012-07-30 ENCOUNTER — Ambulatory Visit
Admission: RE | Admit: 2012-07-30 | Discharge: 2012-07-30 | Disposition: A | Discharge status: Home / Self Care | Payer: 59 | Source: Ambulatory Visit | Attending: Obstetrics and Gynecology | Admitting: Obstetrics and Gynecology

## 2012-07-30 ENCOUNTER — Encounter: Payer: Self-pay | Admitting: Obstetrics and Gynecology

## 2012-07-30 ENCOUNTER — Other Ambulatory Visit: Payer: Self-pay | Admitting: Obstetrics and Gynecology

## 2012-07-30 ENCOUNTER — Telehealth: Payer: Self-pay | Admitting: Obstetrics and Gynecology

## 2012-07-30 NOTE — Telephone Encounter (Signed)
Tc to pt per telephone call. Told pt thyroid testings=wnl and pap smear=wnl. Pt voices understanding.

## 2012-10-01 ENCOUNTER — Ambulatory Visit (INDEPENDENT_AMBULATORY_CARE_PROVIDER_SITE_OTHER): Payer: 59 | Admitting: Family Medicine

## 2012-10-01 ENCOUNTER — Telehealth: Payer: Self-pay | Admitting: *Deleted

## 2012-10-01 VITALS — BP 126/82 | HR 78 | Temp 98.5°F | Resp 17 | Ht 69.0 in | Wt 166.0 lb

## 2012-10-01 DIAGNOSIS — H5710 Ocular pain, unspecified eye: Secondary | ICD-10-CM

## 2012-10-01 DIAGNOSIS — R51 Headache: Secondary | ICD-10-CM

## 2012-10-01 DIAGNOSIS — H5711 Ocular pain, right eye: Secondary | ICD-10-CM

## 2012-10-01 DIAGNOSIS — B029 Zoster without complications: Secondary | ICD-10-CM

## 2012-10-01 LAB — POCT CBC
Granulocyte percent: 71.1 %G (ref 37–80)
HCT, POC: 41.7 % (ref 37.7–47.9)
MCH, POC: 26.8 pg — AB (ref 27–31.2)
MCV: 88.6 fL (ref 80–97)
POC LYMPH PERCENT: 21.9 %L (ref 10–50)
RBC: 4.71 M/uL (ref 4.04–5.48)
WBC: 7.8 10*3/uL (ref 4.6–10.2)

## 2012-10-01 LAB — POCT SEDIMENTATION RATE: POCT SED RATE: 23 mm/hr — AB (ref 0–22)

## 2012-10-01 MED ORDER — VALACYCLOVIR HCL 1 G PO TABS: 1000.0000 mg | ORAL_TABLET | Freq: Three times a day (TID) | ORAL | Status: DC

## 2012-10-01 MED ORDER — GABAPENTIN 300 MG PO CAPS: ORAL_CAPSULE | ORAL | Status: DC

## 2012-10-01 MED ORDER — PREDNISONE 20 MG PO TABS: ORAL_TABLET | ORAL | Status: DC

## 2012-10-01 NOTE — Progress Notes (Signed)
Reviewed and agree.

## 2012-10-01 NOTE — Patient Instructions (Addendum)
1. Shingles rash  POCT SEDIMENTATION RATE, POCT CBC  2. Herpes zoster  valACYclovir (VALTREX) 1000 MG tablet, predniSONE (DELTASONE) 20 MG tablet, gabapentin (NEURONTIN) 300 MG capsule, Ambulatory referral to Ophthalmology  3. Right facial pain    4. Pain, eye, right  Ambulatory referral to Ophthalmology

## 2012-10-01 NOTE — Telephone Encounter (Signed)
Rhonda [name] was called to be informed of her appointment with Dr. Ward Givens tomorrow morning at 11:30 am.  Rhonda [name] stated that would be a good time for her.

## 2012-10-01 NOTE — Progress Notes (Signed)
[address]   [place], [place]  [address]   [phone]   Subjective:    Patient ID: Rhonda [name], female    DOB: 02/14/1976, 36 y.o.   MRN: [id number]  HPIThis 36 y.o. female presents for evaluation of facial and eye pain for six days.  Pain onset six days ago.  No fever but +chills.  Rash developed R periorbital region three days ago.  +tingling; +itching.  +swelling in front of ear R.  No vision changes/blurred vision.  No nasal involvement; no rash on nose.  Severe eye pain R; some tearing of R eye.  No blurred vision/diplopia.  No foreign body sensation.  Eye pain is most bothersome symptom.  No facial weakness.  Works a lot but no excessive stressors.  +chicken pox as a child.  Works at [employer]; works with public daily.  Review of Systems  Constitutional: Positive for chills and fatigue. Negative for fever and diaphoresis.  HENT: Positive for neck pain. Negative for hearing loss, ear pain, neck stiffness, tinnitus and ear discharge.   Eyes: Positive for pain and redness. Negative for photophobia, discharge, itching and visual disturbance.  Skin: Positive for rash. Negative for color change, pallor and wound.  Neurological: Positive for numbness and headaches. Negative for dizziness, tremors, seizures, syncope, facial asymmetry, speech difficulty, weakness and light-headedness.  Hematological: Positive for adenopathy.  Psychiatric/Behavioral: Negative for confusion.    Past Medical History  Diagnosis Date  . Enlarged ovary   . Chronic pelvic pain in female   . Arrhythmia   . Fibroids   . Hyperemesis arising during pregnancy   . BV (bacterial vaginosis)   . Allergy     Past Surgical History  Procedure Date  . Vaginal hysterectomy   . Laparoscopy   . Tubal ligation     Prior to Admission medications   Medication Sig Start Date End Date Taking? Authorizing Provider  cetirizine (ZYRTEC) 10 MG tablet Take 10 mg by mouth daily.   Yes Historical Provider, MD  ibuprofen  (ADVIL,MOTRIN) 400 MG tablet Take 400 mg by mouth every 6 (six) hours as needed.   Yes Historical Provider, MD  gabapentin (NEURONTIN) 300 MG capsule One tablet qhs x 3 days, then one tablet bid x 3 days, then one tablet tid for nerve pain 10/01/12   Ethelda Chick, MD  Multiple Vitamin (MULTIVITAMIN) tablet Take 1 tablet by mouth daily.    Historical Provider, MD  predniSONE (DELTASONE) 20 MG tablet Three tablets daily x 1 day, then two tablets daily x 5 days, then one tablet daily x 5 days 10/01/12   Ethelda Chick, MD  valACYclovir (VALTREX) 1000 MG tablet Take 1 tablet (1,000 mg total) by mouth 3 (three) times daily. 10/01/12   Ethelda Chick, MD    No Known Allergies  History   Social History  . Marital Status: Married    Spouse Name: N/A    Number of Children: N/A  . Years of Education: N/A   Occupational History  . Not on file.   Social History Main Topics  . Smoking status: Never Smoker   . Smokeless tobacco: Never Used  . Alcohol Use: No  . Drug Use: No  . Sexually Active: Yes    Birth Control/ Protection: Surgical     Comment: Hysterectomy   Other Topics Concern  . Not on file   Social History Narrative  . No narrative on file    Family History  Problem Relation Age  of Onset  . Cancer Maternal Grandmother     uterine  . Cancer Maternal Aunt     uterine  . Cancer Maternal Grandfather         Objective:   Physical Exam  Nursing note and vitals reviewed. Constitutional: She is oriented to person, place, and time. She appears well-developed and well-nourished. No distress.  HENT:  Head: Normocephalic and atraumatic.  Left Ear: External ear normal.  Nose: Nose normal.  Mouth/Throat: Oropharynx is clear and moist. No oropharyngeal exudate.       +PREAURICULAR R LAD.  Eyes: EOM and lids are normal. Pupils are equal, round, and reactive to light. Right eye exhibits no discharge and no exudate. No foreign body present in the right eye. Left eye exhibits no  discharge and no exudate. No foreign body present in the left eye. Right conjunctiva is injected. Right conjunctiva has no hemorrhage. Left conjunctiva is not injected. Left conjunctiva has no hemorrhage. No scleral icterus.       PROCEDURE:  FLUORESCEIN APPLIED TO R EYE; NO UPTAKE.  IMMEDIATE RELIEF OF EYE PAIN WITH ANESTHETIC TOPICAL EYE DROP/OPTHANE.  Neck: Normal range of motion. Neck supple. No thyromegaly present.  Cardiovascular: Normal rate, regular rhythm and normal heart sounds.   Pulmonary/Chest: Effort normal and breath sounds normal. No respiratory distress. She has no wheezes. She has no rales.  Musculoskeletal:       Cervical back: She exhibits pain. She exhibits normal range of motion, no tenderness and no bony tenderness.  Lymphadenopathy:    She has no cervical adenopathy.  Neurological: She is alert and oriented to person, place, and time. No cranial nerve deficit. She exhibits normal muscle tone. Coordination normal.  Skin: Rash noted. She is not diaphoretic.       CLUSTER OF MACULOPAPULAR RASH R TEMPLE REGION; SMALLER CLUSTER OF RASH ALONG HAIR LINE.    Psychiatric: She has a normal mood and affect. Her behavior is normal. Judgment and thought content normal.    Results for orders placed in visit on 10/01/12  POCT CBC      Component Value Range   WBC 7.8  4.6 - 10.2 K/uL   Lymph, poc 1.7  0.6 - 3.4   POC LYMPH PERCENT 21.9  10 - 50 %L   MID (cbc) 0.5  0 - 0.9   POC MID % 7.0  0 - 12 %M   POC Granulocyte 5.5  2 - 6.9   Granulocyte percent 71.1  37 - 80 %G   RBC 4.71  4.04 - 5.48 M/uL   Hemoglobin 12.6  12.2 - 16.2 g/dL   HCT, POC 95.6  21.3 - 47.9 %   MCV 88.6  80 - 97 fL   MCH, POC 26.8 (*) 27 - 31.2 pg   MCHC 30.2 (*) 31.8 - 35.4 g/dL   RDW, POC 08.6     Platelet Count, POC 322  142 - 424 K/uL   MPV 7.7  0 - 99.8 fL       Assessment & Plan:   1. Shingles rash  POCT SEDIMENTATION RATE, POCT CBC  2. Herpes zoster  valACYclovir (VALTREX) 1000 MG tablet,  predniSONE (DELTASONE) 20 MG tablet, gabapentin (NEURONTIN) 300 MG capsule  3. Right facial pain    4. Pain, eye, right       1. Pain R eye: New.  Consistent with acute Zoster; refer to ophthalmology to rule out ophthalmic Zoster. 2.  Pain R facial region: New.  Consistent  with acute Zoster.  Rx for Neurontin. 3.  Rash R facial region:  New.  Consistent with acute Zoster.   4.  Herpes Zoster, Facial R:  New onset.  Rx for Valtrex, Prednisone, Neurontin.  Refer to ophthalmology today to rule out ophthalmic Zoster infection.

## 2012-10-02 NOTE — Telephone Encounter (Signed)
Below noted.  KMS 

## 2012-10-03 ENCOUNTER — Telehealth: Payer: Self-pay

## 2012-10-03 NOTE — Telephone Encounter (Signed)
Pt says she is in pain, was here to see Dr Katrinka Blazing, would like a nurse or Dr Katrinka Blazing to call her about possibly taking a leave from work. [phone]

## 2012-10-04 NOTE — Telephone Encounter (Signed)
Advil, Aleve, or Tylenol is fine for pain.  The neurontin should help with pain as well.  If her pain is worsening though, she should RTC

## 2012-10-04 NOTE — Telephone Encounter (Signed)
Patient is experiencing pain and would like to know if she can take aleve or ibuprofen.  Or can we call her something in for the pain.

## 2012-10-04 NOTE — Telephone Encounter (Signed)
LMOM to call back

## 2012-10-04 NOTE — Telephone Encounter (Signed)
Pt called back and I gave her instr's from [place]. Pt agreed to try Aleve and Tyl and to RTC if persists/worsens.

## 2013-08-25 ENCOUNTER — Ambulatory Visit: Payer: 59

## 2013-08-25 ENCOUNTER — Ambulatory Visit: Payer: 59 | Admitting: Family Medicine

## 2013-08-25 VITALS — BP 115/74 | HR 71 | Temp 98.7°F | Resp 18 | Ht 68.5 in | Wt 159.4 lb

## 2013-08-25 DIAGNOSIS — R1012 Left upper quadrant pain: Secondary | ICD-10-CM

## 2013-08-25 DIAGNOSIS — R0602 Shortness of breath: Secondary | ICD-10-CM

## 2013-08-25 DIAGNOSIS — R079 Chest pain, unspecified: Secondary | ICD-10-CM

## 2013-08-25 DIAGNOSIS — M549 Dorsalgia, unspecified: Secondary | ICD-10-CM

## 2013-08-25 DIAGNOSIS — R319 Hematuria, unspecified: Secondary | ICD-10-CM

## 2013-08-25 DIAGNOSIS — R5381 Other malaise: Secondary | ICD-10-CM

## 2013-08-25 DIAGNOSIS — R531 Weakness: Secondary | ICD-10-CM

## 2013-08-25 LAB — COMPREHENSIVE METABOLIC PANEL WITH GFR
ALT: <8 U/L (ref 0–35)
AST: 12 U/L (ref 0–37)
Alkaline Phosphatase: 47 U/L (ref 39–117)
BUN: 8 mg/dL (ref 6–23)
CO2: 29 meq/L (ref 19–32)
Calcium: 9.1 mg/dL (ref 8.4–10.5)
Glucose, Bld: 90 mg/dL (ref 70–99)
Sodium: 138 meq/L (ref 135–145)

## 2013-08-25 LAB — POCT CBC
Granulocyte percent: 70.9 % (ref 37–80)
HCT, POC: 42.3 % (ref 37.7–47.9)
Hemoglobin: 13 g/dL (ref 12.2–16.2)
Lymph, poc: 2.4 (ref 0.6–3.4)
MCH, POC: 28.1 pg (ref 27–31.2)
MCHC: 30.7 g/dL — AB (ref 31.8–35.4)
MCV: 91.3 fL (ref 80–97)
MID (cbc): 0.6 (ref 0–0.9)
MPV: 7.9 fL (ref 0–99.8)
POC Granulocyte: 7.4 — AB (ref 2–6.9)
POC LYMPH PERCENT: 23.4 %L (ref 10–50)
POC MID %: 5.7 %M (ref 0–12)
Platelet Count, POC: 318 10*3/uL (ref 142–424)
RBC: 4.63 M/uL (ref 4.04–5.48)
RDW, POC: 15 %
WBC: 10.4 10*3/uL — AB (ref 4.6–10.2)

## 2013-08-25 LAB — POCT URINALYSIS DIPSTICK
Bilirubin, UA: NEGATIVE
Glucose, UA: NEGATIVE
Ketones, UA: 80
Leukocytes, UA: NEGATIVE
Nitrite, UA: NEGATIVE
Protein, UA: NEGATIVE
Spec Grav, UA: 1.015
Urobilinogen, UA: 0.2
pH, UA: 5.5

## 2013-08-25 LAB — COMPREHENSIVE METABOLIC PANEL
Albumin: 3.9 g/dL (ref 3.5–5.2)
Chloride: 102 mEq/L (ref 96–112)
Creat: 0.78 mg/dL (ref 0.50–1.10)
Potassium: 4 mEq/L (ref 3.5–5.3)
Total Bilirubin: 0.3 mg/dL (ref 0.3–1.2)
Total Protein: 6.6 g/dL (ref 6.0–8.3)

## 2013-08-25 LAB — POCT UA - MICROSCOPIC ONLY
Casts, Ur, LPF, POC: NEGATIVE
Crystals, Ur, HPF, POC: NEGATIVE
Yeast, UA: NEGATIVE

## 2013-08-25 MED ORDER — KETOROLAC TROMETHAMINE 30 MG/ML IJ SOLN
30.0000 mg | Freq: Once | INTRAMUSCULAR | Status: AC
  Administered 2013-08-25: 30 mg via INTRAMUSCULAR

## 2013-08-25 MED ORDER — HYDROCODONE-ACETAMINOPHEN 5-325 MG PO TABS: 1.0000 | ORAL_TABLET | Freq: Four times a day (QID) | ORAL | Status: DC | PRN

## 2013-08-25 MED ORDER — TAMSULOSIN HCL 0.4 MG PO CAPS: 0.4000 mg | ORAL_CAPSULE | Freq: Every day | ORAL | Status: DC

## 2013-08-25 NOTE — Patient Instructions (Signed)

## 2013-08-25 NOTE — Progress Notes (Signed)
[facility]:  Office Visit  Chief Complaint:  Chief Complaint  Patient presents with  . Chest Pain    X yesterday  . Shortness of Breath    X yesterday    HPI: Rhonda [name] is a 37 y.o. female who is here for chest pain with SOB since yesterday morning, woke up with it Intermittent shooting pains but there is a consistent pain that radiates, when shooting pain 12/10 pain Lower chest/left rib cage pain that radiates to her back   No new exercises, + chills, hurts with movement, + abd pain left upper and back pain Denies fevers, nausea, vomiting, urinary sxs, vaginal dc, or rashes, still making urine Nonsmoker No HTN, XOL, DM, family hx of premature MI/CVA No risk factors for DVT--no calf pain, no swelling, no recent long travels by plane/car, no malignancies, no recent surgeries, 2 weeks twisted ankle , no prior DVT/PE, no OCP use She has had hysterectomy for fibroids, last [place] was in 2006 results are below:  TRANSABDOMINAL AND TRANSVAGINAL PELVIC ULTRASOUND:  Comparison with CT of the abdomen and pelvis on 01/31/05.  Transabdominal and transvaginal images of the pelvis were performed.  The uterus is surgically absent. The right ovary is 3.4 x 2.5 x 2.5 cm. A corpus luteum cyst on the right measures 2.0 x 1.3 cm. The left ovary is 2.9 x 1.6 x 1.9 cm. Note is made of bilateral hydrosalpinges, measuring a maximum diameter of 1 cm. No solid adnexal masses are identified.  IMPRESSION:  1. Status post hysterectomy.  2. Normal appearing ovaries.  3. Bilateral hydrosalpinges.    Past Medical History  Diagnosis Date  . Enlarged ovary   . Chronic pelvic pain in female   . Arrhythmia   . Fibroids   . Hyperemesis arising during pregnancy   . BV (bacterial vaginosis)   . Allergy    Past Surgical History  Procedure Laterality Date  . Vaginal hysterectomy    . Laparoscopy    . Tubal ligation     History   Social History  . Marital Status: Married    Spouse  Name: N/A    Number of Children: N/A  . Years of Education: N/A   Social History Main Topics  . Smoking status: Never Smoker   . Smokeless tobacco: Never Used  . Alcohol Use: No  . Drug Use: No  . Sexual Activity: Yes    Birth Control/ Protection: Surgical     Comment: Hysterectomy   Other Topics Concern  . None   Social History Narrative  . None   Family History  Problem Relation Age of Onset  . Cancer Maternal Grandmother     uterine  . Cancer Maternal Aunt     uterine  . Cancer Maternal Grandfather    No Known Allergies Prior to Admission medications   Medication Sig Start Date End Date Taking? Authorizing Provider  ibuprofen (ADVIL,MOTRIN) 400 MG tablet Take 400 mg by mouth every 6 (six) hours as needed.   Yes Historical Provider, MD  cetirizine (ZYRTEC) 10 MG tablet Take 10 mg by mouth daily.    Historical Provider, MD  gabapentin (NEURONTIN) 300 MG capsule One tablet qhs x 3 days, then one tablet bid x 3 days, then one tablet tid for nerve pain 10/01/12   Ethelda Chick, MD  Multiple Vitamin (MULTIVITAMIN) tablet Take 1 tablet by mouth daily.    Historical Provider, MD  predniSONE (DELTASONE) 20 MG tablet Three tablets daily  x 1 day, then two tablets daily x 5 days, then one tablet daily x 5 days 10/01/12   Ethelda Chick, MD  valACYclovir (VALTREX) 1000 MG tablet Take 1 tablet (1,000 mg total) by mouth 3 (three) times daily. 10/01/12   Ethelda Chick, MD     ROS: The patient denies fevers, chills, night sweats, unintentional weight loss, palpitations, wheezing, dyspnea on exertion, nausea, vomiting, dysuria, hematuria, melena, numbness,  or tingling.   All other systems have been reviewed and were otherwise negative with the exception of those mentioned in the HPI and as above.    PHYSICAL EXAM: Filed Vitals:   08/25/13 1413  BP: 115/74  Pulse: 71  Temp: 98.7 F (37.1 C)  Resp: 18   Filed Vitals:   08/25/13 1413  Height: 5' 8.5" (1.74 m)  Weight: 159 lb  6.4 oz (72.303 kg)  Spo2 100% Body mass index is 23.88 kg/(m^2).  General: Alert, mild-mod distress HEENT:  Normocephalic, atraumatic, oropharynx patent. EOMI, PERRLA Cardiovascular:  Regular rate and rhythm, no rubs murmurs or gallops.  No Carotid bruits, radial pulse intact. No pedal edema.  Respiratory: Clear to auscultation bilaterally.  No wheezes, rales, or rhonchi.  No cyanosis, no use of accessory musculature GI: No organomegaly, abdomen is soft and non-tender, positive bowel sounds.  No masses. Skin: No rashes. Neurologic: Facial musculature symmetric. Psychiatric: Patient is appropriate throughout our interaction. Lymphatic: No cervical lymphadenopathy Musculoskeletal: Gait intact. + CVA tenderness, and back tenderness on upper left   LABS: Results for orders placed in visit on 08/25/13  POCT CBC      Result Value Range   WBC 10.4 (*) 4.6 - 10.2 K/uL   Lymph, poc 2.4  0.6 - 3.4   POC LYMPH PERCENT 23.4  10 - 50 %L   MID (cbc) 0.6  0 - 0.9   POC MID % 5.7  0 - 12 %M   POC Granulocyte 7.4 (*) 2 - 6.9   Granulocyte percent 70.9  37 - 80 %G   RBC 4.63  4.04 - 5.48 M/uL   Hemoglobin 13.0  12.2 - 16.2 g/dL   HCT, POC 16.1  09.6 - 47.9 %   MCV 91.3  80 - 97 fL   MCH, POC 28.1  27 - 31.2 pg   MCHC 30.7 (*) 31.8 - 35.4 g/dL   RDW, POC 04.5     Platelet Count, POC 318  142 - 424 K/uL   MPV 7.9  0 - 99.8 fL  POCT URINALYSIS DIPSTICK      Result Value Range   Color, UA yellow     Clarity, UA clear     Glucose, UA neg     Bilirubin, UA neg     Ketones, UA 80     Spec Grav, UA 1.015     Blood, UA moderate     pH, UA 5.5     Protein, UA neg     Urobilinogen, UA 0.2     Nitrite, UA neg     Leukocytes, UA Negative    POCT UA - MICROSCOPIC ONLY      Result Value Range   WBC, Ur, HPF, POC 1-3     RBC, urine, microscopic 3-6     Bacteria, U Microscopic trace     Mucus, UA large     Epithelial cells, urine per micros 4-8     Crystals, Ur, HPF, POC neg     Casts, Ur,  LPF, POC  neg     Yeast, UA neg       EKG/XRAY:   Primary read interpreted by Dr. Conley Rolls at [facility]. No acute cardio pulmonary process No obstruction on KUB, no obvious kidney or ureteral stones   ASSESSMENT/PLAN: Encounter Diagnoses  Name Primary?  . Chest pain   . SOB (shortness of breath)   . Weakness   . Back pain   . Abdominal pain, left upper quadrant Yes  . Hematuria    Hematuria without UTI and CVA tenderness Possible renal stone vs costochondritis vs less likely GERD vs pelvic etiology Unlikely cardiac or pulmonary in origin, no risk factors Will try tordal, flomax and also lortab, push fluids, strainer given If worsening sxs go to ER or f/u here  Prn, f/u in 24-48 hrs for either Gross sideeffects, risk and benefits, and alternatives of medications d/w patient. Patient is aware that all medications have potential sideeffects and we are unable to predict every sideeffect or drug-drug interaction that may occur.  LE, THAO PHUONG, DO 08/25/2013 4:48 PM    08/26/2013 @ 9:19 LM to see how she is doing today.

## 2013-08-26 ENCOUNTER — Telehealth: Payer: Self-pay | Admitting: Family Medicine

## 2013-08-26 NOTE — Telephone Encounter (Signed)
She is doing better. Still has pain but better. Advise to return to office or go to ER prn for worsening sxs

## 2013-12-10 ENCOUNTER — Other Ambulatory Visit: Payer: Self-pay

## 2013-12-10 DIAGNOSIS — Z1231 Encounter for screening mammogram for malignant neoplasm of breast: Secondary | ICD-10-CM

## 2013-12-24 ENCOUNTER — Ambulatory Visit: Payer: 59

## 2014-01-06 ENCOUNTER — Ambulatory Visit
Admission: RE | Admit: 2014-01-06 | Discharge: 2014-01-06 | Disposition: A | Discharge status: Home / Self Care | Payer: 59 | Source: Ambulatory Visit

## 2014-01-06 DIAGNOSIS — Z1231 Encounter for screening mammogram for malignant neoplasm of breast: Secondary | ICD-10-CM

## 2014-01-07 ENCOUNTER — Other Ambulatory Visit: Payer: Self-pay | Admitting: Obstetrics and Gynecology

## 2014-01-07 DIAGNOSIS — N63 Unspecified lump in unspecified breast: Secondary | ICD-10-CM

## 2014-01-22 ENCOUNTER — Ambulatory Visit
Admission: RE | Admit: 2014-01-22 | Discharge: 2014-01-22 | Disposition: A | Discharge status: Home / Self Care | Payer: 59 | Source: Ambulatory Visit | Attending: Obstetrics and Gynecology | Admitting: Obstetrics and Gynecology

## 2014-01-22 DIAGNOSIS — N63 Unspecified lump in unspecified breast: Secondary | ICD-10-CM

## 2014-08-01 ENCOUNTER — Inpatient Hospital Stay (HOSPITAL_COMMUNITY): Payer: 59

## 2014-09-04 ENCOUNTER — Other Ambulatory Visit: Payer: Self-pay

## 2014-09-04 DIAGNOSIS — N6002 Solitary cyst of left breast: Secondary | ICD-10-CM

## 2014-09-04 DIAGNOSIS — N644 Mastodynia: Secondary | ICD-10-CM

## 2014-09-15 ENCOUNTER — Other Ambulatory Visit: Payer: Self-pay | Admitting: Obstetrics and Gynecology

## 2014-09-15 ENCOUNTER — Ambulatory Visit
Admission: RE | Admit: 2014-09-15 | Discharge: 2014-09-15 | Disposition: A | Discharge status: Home / Self Care | Payer: 59 | Source: Ambulatory Visit

## 2014-09-15 ENCOUNTER — Encounter (INDEPENDENT_AMBULATORY_CARE_PROVIDER_SITE_OTHER): Payer: Self-pay

## 2014-09-15 DIAGNOSIS — N644 Mastodynia: Secondary | ICD-10-CM

## 2014-09-17 ENCOUNTER — Ambulatory Visit
Admission: RE | Admit: 2014-09-17 | Discharge: 2014-09-17 | Disposition: A | Discharge status: Home / Self Care | Payer: 59 | Source: Ambulatory Visit | Attending: Obstetrics and Gynecology | Admitting: Obstetrics and Gynecology

## 2014-09-17 DIAGNOSIS — N644 Mastodynia: Secondary | ICD-10-CM

## 2015-11-14 DIAGNOSIS — Z87820 Personal history of traumatic brain injury: Secondary | ICD-10-CM

## 2015-11-14 HISTORY — DX: Personal history of traumatic brain injury: Z87.820

## 2016-03-28 ENCOUNTER — Ambulatory Visit (INDEPENDENT_AMBULATORY_CARE_PROVIDER_SITE_OTHER): Payer: 59 | Admitting: Physician Assistant

## 2016-03-28 VITALS — BP 118/72 | HR 75 | Temp 98.2°F | Resp 16 | Ht 70.5 in | Wt 167.0 lb

## 2016-03-28 DIAGNOSIS — K5900 Constipation, unspecified: Secondary | ICD-10-CM | POA: Diagnosis not present

## 2016-03-28 DIAGNOSIS — R1013 Epigastric pain: Secondary | ICD-10-CM

## 2016-03-28 DIAGNOSIS — R350 Frequency of micturition: Secondary | ICD-10-CM

## 2016-03-28 LAB — POCT URINALYSIS DIP (MANUAL ENTRY)
Bilirubin, UA: NEGATIVE
Glucose, UA: NEGATIVE
Ketones, POC UA: NEGATIVE
Leukocytes, UA: NEGATIVE
NITRITE UA: NEGATIVE
PH UA: 7
Protein Ur, POC: NEGATIVE
Spec Grav, UA: 1.01
UROBILINOGEN UA: 0.2

## 2016-03-28 LAB — POCT CBC
GRANULOCYTE PERCENT: 69.9 % (ref 37–80)
HCT, POC: 41.5 % (ref 37.7–47.9)
HEMOGLOBIN: 14.3 g/dL (ref 12.2–16.2)
Lymph, poc: 2.3 (ref 0.6–3.4)
MCH: 28.5 pg (ref 27–31.2)
MCHC: 34.4 g/dL (ref 31.8–35.4)
MCV: 82.9 fL (ref 80–97)
MID (cbc): 0.3 (ref 0–0.9)
MPV: 6.7 fL (ref 0–99.8)
PLATELET COUNT, POC: 326 10*3/uL (ref 142–424)
POC Granulocyte: 6.2 (ref 2–6.9)
POC LYMPH PERCENT: 26.2 %L (ref 10–50)
POC MID %: 3.9 % (ref 0–12)
RBC: 5.01 M/uL (ref 4.04–5.48)
RDW, POC: 14 %
WBC: 8.8 10*3/uL (ref 4.6–10.2)

## 2016-03-28 LAB — POCT WET + KOH PREP
Trich by wet prep: ABSENT
YEAST BY WET PREP: ABSENT
Yeast by KOH: ABSENT

## 2016-03-28 LAB — GLUCOSE, POCT (MANUAL RESULT ENTRY): POC Glucose: 106 mg/dl — AB (ref 70–99)

## 2016-03-28 LAB — POC MICROSCOPIC URINALYSIS (UMFC): Mucus: ABSENT

## 2016-03-28 LAB — POCT GLYCOSYLATED HEMOGLOBIN (HGB A1C): HEMOGLOBIN A1C: 5.5

## 2016-03-28 MED ORDER — OMEPRAZOLE 20 MG PO CPDR: 20.0000 mg | DELAYED_RELEASE_CAPSULE | Freq: Every day | ORAL | Status: DC

## 2016-03-28 MED ORDER — RANITIDINE HCL 150 MG PO TABS: 150.0000 mg | ORAL_TABLET | Freq: Two times a day (BID) | ORAL | Status: DC

## 2016-03-28 MED ORDER — POLYETHYLENE GLYCOL 3350 17 GM/SCOOP PO POWD
17.0000 g | Freq: Two times a day (BID) | ORAL | Status: DC | PRN
Start: 2016-03-28 — End: 2017-01-05

## 2016-03-28 NOTE — Patient Instructions (Addendum)
IF you received an x-ray today, you will receive an invoice from [facility]. Please contact [facility] at [phone] with questions or concerns regarding your invoice.   IF you received labwork today, you will receive an invoice from [employer]. Please contact [facility] at [phone] with questions or concerns regarding your invoice.   Our billing staff will not be able to assist you with questions regarding bills from these companies.  You will be contacted with the lab results as soon as they are available. The fastest way to get your results is to activate your My Chart account. Instructions are located on the last page of this paperwork. If you have not heard from [place] regarding the results in 2 weeks, please contact this office.    I would like you to take the ranitidine and the Prilosec for the next 2 weeks.  You will also do the miralax for the constipation for the next week.  If this resolves, you may take this symptomatically or once per day.   If you continue to have the symptoms, you need to let me know.   Constipation, Adult Constipation is when a person has fewer than three bowel movements a week, has difficulty having a bowel movement, or has stools that are dry, hard, or larger than normal. As people grow older, constipation is more common. A low-fiber diet, not taking in enough fluids, and taking certain medicines may make constipation worse.  CAUSES   Certain medicines, such as antidepressants, pain medicine, iron supplements, antacids, and water pills.   Certain diseases, such as diabetes, irritable bowel syndrome (IBS), thyroid disease, or depression.   Not drinking enough water.   Not eating enough fiber-rich foods.   Stress or travel.   Lack of physical activity or exercise.   Ignoring the urge to have a bowel movement.   Using laxatives too much.  SIGNS AND SYMPTOMS   Having fewer than three bowel  movements a week.   Straining to have a bowel movement.   Having stools that are hard, dry, or larger than normal.   Feeling full or bloated.   Pain in the lower abdomen.   Not feeling relief after having a bowel movement.  DIAGNOSIS  Your health care provider will take a medical history and perform a physical exam. Further testing may be done for severe constipation. Some tests may include:  A barium enema X-ray to examine your rectum, colon, and, sometimes, your small intestine.   A sigmoidoscopy to examine your lower colon.   A colonoscopy to examine your entire colon. TREATMENT  Treatment will depend on the severity of your constipation and what is causing it. Some dietary treatments include drinking more fluids and eating more fiber-rich foods. Lifestyle treatments may include regular exercise. If these diet and lifestyle recommendations do not help, your health care provider may recommend taking over-the-counter laxative medicines to help you have bowel movements. Prescription medicines may be prescribed if over-the-counter medicines do not work.  HOME CARE INSTRUCTIONS   Eat foods that have a lot of fiber, such as fruits, vegetables, whole grains, and beans.  Limit foods high in fat and processed sugars, such as french fries, hamburgers, cookies, candies, and soda.   A fiber supplement may be added to your diet if you cannot get enough fiber from foods.   Drink enough fluids to keep your urine clear or pale yellow.   Exercise regularly or as directed by your health care  provider.   Go to the restroom when you have the urge to go. Do not hold it.   Only take over-the-counter or prescription medicines as directed by your health care provider. Do not take other medicines for constipation without talking to your health care provider first.  [place] IF:   You have bright red blood in your stool.   Your constipation lasts for more than 4  days or gets worse.   You have abdominal or rectal pain.   You have thin, pencil-like stools.   You have unexplained weight loss. MAKE SURE YOU:   Understand these instructions.  Will watch your condition.  Will get help right away if you are not doing well or get worse.   This information is not intended to replace advice given to you by your health care provider. Make sure you discuss any questions you have with your health care provider.   Document Released: 07/28/2004 Document Revised: 11/20/2014 Document Reviewed: 08/11/2013 Elsevier Interactive Patient Education [employer].

## 2016-03-29 NOTE — Progress Notes (Signed)
[facility] [address], [place] [address] [phone]  Date:  03/28/2016   Name:  Rhonda [name]   DOB:  Aug 15, 1976   MRN:  [id number]  PCP:  Johnnette Barrios, RN    History of Present Illness:  Rhonda [name] is a 40 y.o. non-smoker female patient who presents to [facility] for cc of urinary frequency.  Patient reports that she has had 1 week of frequent urination, epigastric and lower abdominal pain.  Stomach pain is constant for 1 week.  She has some nausea.  No back pain or fever.  She has no dysuria.  She has chronic hematuria.  This is followed by [facility].  Recommended CT, however patient has yet to schedule.  She had a partial hysterectomy, remaining ovaries, years ago.  She has a hx of constipation.  bm 2-3 times per week.  Patient does not look at her feces, but reports no hx of blood in stool, or black stool.  No hx of IBD.  She also complains that during the time that she eats, she has right toe pain.   Diet is poor, eats fried foods, desserts, breads.  She will get vegetables in daily.  Drinks good source of water.  Low-fiber intake.   She denies early satiety.  No weight loss, night sweats.     Patient Active Problem List   Diagnosis Date Noted  . Enlarged thyroid 07/24/2012  . Breast lump on left side at 3 o'clock position 07/24/2012    Past Medical History  Diagnosis Date  . Enlarged ovary   . Chronic pelvic pain in female   . Arrhythmia   . Fibroids   . Hyperemesis arising during pregnancy   . BV (bacterial vaginosis)   . Allergy     Past Surgical History  Procedure Laterality Date  . Vaginal hysterectomy    . Laparoscopy    . Tubal ligation      Social History  Substance Use Topics  . Smoking status: Never Smoker   . Smokeless tobacco: Never Used  . Alcohol Use: No    Family History  Problem Relation Age of Onset  . Cancer Maternal Grandmother     uterine  . Cancer Maternal Aunt     uterine  . Cancer  Maternal Grandfather     No Known Allergies  Medication list has been reviewed and updated.  Current Outpatient Prescriptions on File Prior to Visit  Medication Sig Dispense Refill  . ibuprofen (ADVIL,MOTRIN) 400 MG tablet Take 400 mg by mouth every 6 (six) hours as needed.     No current facility-administered medications on file prior to visit.    ROS ROS otherwise unremarkable unless listed above.   Physical Examination: BP 118/72 mmHg  Pulse 75  Temp(Src) 98.2 F (36.8 C) (Oral)  Resp 16  Ht 5' 10.5" (1.791 m)  Wt 167 lb (75.751 kg)  BMI 23.62 kg/m2  SpO2 97% Ideal Body Weight: Weight in (lb) to have BMI = 25: 176.4  Physical Exam  Constitutional: She is oriented to person, place, and time. She appears well-developed and well-nourished. No distress.  HENT:  Head: Normocephalic and atraumatic.  Right Ear: External ear normal.  Left Ear: External ear normal.  Eyes: Conjunctivae and EOM are normal. Pupils are equal, round, and reactive to light.  Cardiovascular: Normal rate.   Pulmonary/Chest: Effort normal. No respiratory distress.  Abdominal: Hernia confirmed negative in the right inguinal area and confirmed negative  in the left inguinal area.  Genitourinary: Pelvic exam was performed with patient supine. There is no rash on the right labia. There is no rash on the left labia. Cervix exhibits no motion tenderness and no friability. Right adnexum displays tenderness (right sided tenderness without detectable mass). Right adnexum displays no mass and no fullness. Left adnexum displays no mass and no fullness.  Neurological: She is alert and oriented to person, place, and time.  Skin: She is not diaphoretic.  Psychiatric: She has a normal mood and affect. Her behavior is normal.    Results for orders placed or performed in visit on 03/28/16  POCT urinalysis dipstick  Result Value Ref Range   Color, UA yellow yellow   Clarity, UA clear clear   Glucose, UA negative  negative   Bilirubin, UA negative negative   Ketones, POC UA negative negative   Spec Grav, UA 1.010    Blood, UA small (A) negative   pH, UA 7.0    Protein Ur, POC negative negative   Urobilinogen, UA 0.2    Nitrite, UA Negative Negative   Leukocytes, UA Negative Negative  POCT Microscopic Urinalysis ([facility])  Result Value Ref Range   WBC,UR,HPF,POC None None WBC/hpf   RBC,UR,HPF,POC None None RBC/hpf   Bacteria None None, Too numerous to count   Mucus Absent Absent   Epithelial Cells, UR Per Microscopy Few (A) None, Too numerous to count cells/hpf  POCT CBC  Result Value Ref Range   WBC 8.8 4.6 - 10.2 K/uL   Lymph, poc 2.3 0.6 - 3.4   POC LYMPH PERCENT 26.2 10 - 50 %L   MID (cbc) 0.3 0 - 0.9   POC MID % 3.9 0 - 12 %M   POC Granulocyte 6.2 2 - 6.9   Granulocyte percent 69.9 37 - 80 %G   RBC 5.01 4.04 - 5.48 M/uL   Hemoglobin 14.3 12.2 - 16.2 g/dL   HCT, POC 41.5 37.7 - 47.9 %   MCV 82.9 80 - 97 fL   MCH, POC 28.5 27 - 31.2 pg   MCHC 34.4 31.8 - 35.4 g/dL   RDW, POC 14.0 %   Platelet Count, POC 326 142 - 424 K/uL   MPV 6.7 0 - 99.8 fL  POCT glucose (manual entry)  Result Value Ref Range   POC Glucose 106 (A) 70 - 99 mg/dl  POCT Wet + KOH Prep  Result Value Ref Range   Yeast by KOH Absent Present, Absent   Yeast by wet prep Absent Present, Absent   WBC by wet prep None None, Few, Too numerous to count   Clue Cells Wet Prep HPF POC None None, Too numerous to count   Trich by wet prep Absent Present, Absent   Bacteria Wet Prep HPF POC Moderate (A) None, Few, Too numerous to count   Epithelial Cells By [employer] Pref ([facility]) Moderate (A) None, Few, Too numerous to count   RBC,UR,HPF,POC None None RBC/hpf   Other sperm   POCT glycosylated hemoglobin (Hb A1C)  Result Value Ref Range   Hemoglobin A1C 5.5      Assessment and Plan: Rhonda [name] is a 40 y.o. female who is here today with cc of frequent urination. This is likely constipation.  I will proceed with urine  culture.  I have encouraged her to schedule the visit for a CT of her urine.   We will do a miralax bid for up to 1 week.  Advised of alarming symptoms  to warrant an immediate return.   We will treat for possible gerd for up to 2 weeks.  She will contact me if her sxs do not resolve.   Frequent urination - Plan: POCT urinalysis dipstick, POCT Microscopic Urinalysis ([facility]), POCT CBC, POCT glucose (manual entry), POCT Wet + KOH Prep, POCT glycosylated hemoglobin (Hb A1C), Urine culture  Constipation, unspecified constipation type - Plan: polyethylene glycol powder (GLYCOLAX/MIRALAX) powder  Epigastric abdominal pain - Plan: ranitidine (ZANTAC) 150 MG tablet, omeprazole (PRILOSEC) 20 MG capsule  Ivar Drape, PA-C [facility] 03/29/2016 7:54 AM

## 2016-03-30 LAB — URINE CULTURE

## 2016-03-30 NOTE — Progress Notes (Signed)
There is a transcription issue on assessment and plan with the word says CT.

## 2016-06-15 ENCOUNTER — Other Ambulatory Visit: Payer: Self-pay | Admitting: Family Medicine

## 2016-06-15 DIAGNOSIS — R102 Pelvic and perineal pain unspecified side: Secondary | ICD-10-CM

## 2016-06-15 DIAGNOSIS — R3129 Other microscopic hematuria: Secondary | ICD-10-CM

## 2016-06-16 ENCOUNTER — Ambulatory Visit
Admission: RE | Admit: 2016-06-16 | Discharge: 2016-06-16 | Disposition: A | Discharge status: Home / Self Care | Payer: 59 | Source: Ambulatory Visit | Attending: Family Medicine | Admitting: Family Medicine

## 2016-06-16 DIAGNOSIS — R3129 Other microscopic hematuria: Secondary | ICD-10-CM

## 2016-06-16 DIAGNOSIS — R102 Pelvic and perineal pain: Secondary | ICD-10-CM

## 2016-06-16 MED ORDER — IOPAMIDOL (ISOVUE-300) INJECTION 61%
100.0000 mL | Freq: Once | INTRAVENOUS | Status: AC | PRN
  Administered 2016-06-16: 100 mL via INTRAVENOUS

## 2016-07-04 ENCOUNTER — Other Ambulatory Visit: Payer: Self-pay | Admitting: Obstetrics and Gynecology

## 2016-07-04 DIAGNOSIS — Z1231 Encounter for screening mammogram for malignant neoplasm of breast: Secondary | ICD-10-CM

## 2016-07-18 ENCOUNTER — Ambulatory Visit
Admission: RE | Admit: 2016-07-18 | Discharge: 2016-07-18 | Disposition: A | Discharge status: Home / Self Care | Payer: 59 | Source: Ambulatory Visit | Attending: Obstetrics and Gynecology | Admitting: Obstetrics and Gynecology

## 2016-07-18 DIAGNOSIS — Z1231 Encounter for screening mammogram for malignant neoplasm of breast: Secondary | ICD-10-CM

## 2017-01-05 ENCOUNTER — Emergency Department (HOSPITAL_COMMUNITY)
Admission: EM | Admit: 2017-01-05 | Discharge: 2017-01-05 | Disposition: A | Discharge status: Home / Self Care | Payer: 59 | Attending: Emergency Medicine | Admitting: Emergency Medicine

## 2017-01-05 ENCOUNTER — Emergency Department (HOSPITAL_COMMUNITY): Payer: 59

## 2017-01-05 DIAGNOSIS — Z79899 Other long term (current) drug therapy: Secondary | ICD-10-CM | POA: Insufficient documentation

## 2017-01-05 DIAGNOSIS — M25511 Pain in right shoulder: Secondary | ICD-10-CM | POA: Diagnosis not present

## 2017-01-05 DIAGNOSIS — Y999 Unspecified external cause status: Secondary | ICD-10-CM | POA: Diagnosis not present

## 2017-01-05 DIAGNOSIS — S199XXA Unspecified injury of neck, initial encounter: Secondary | ICD-10-CM | POA: Diagnosis not present

## 2017-01-05 DIAGNOSIS — Y92410 Unspecified street and highway as the place of occurrence of the external cause: Secondary | ICD-10-CM | POA: Insufficient documentation

## 2017-01-05 DIAGNOSIS — S0990XA Unspecified injury of head, initial encounter: Secondary | ICD-10-CM | POA: Diagnosis not present

## 2017-01-05 DIAGNOSIS — M62838 Other muscle spasm: Secondary | ICD-10-CM | POA: Diagnosis not present

## 2017-01-05 DIAGNOSIS — T148XXA Other injury of unspecified body region, initial encounter: Secondary | ICD-10-CM | POA: Diagnosis not present

## 2017-01-05 DIAGNOSIS — Y939 Activity, unspecified: Secondary | ICD-10-CM | POA: Diagnosis not present

## 2017-01-05 DIAGNOSIS — M542 Cervicalgia: Secondary | ICD-10-CM | POA: Diagnosis not present

## 2017-01-05 DIAGNOSIS — S060X0A Concussion without loss of consciousness, initial encounter: Secondary | ICD-10-CM

## 2017-01-05 LAB — POC URINE PREG, ED: PREG TEST UR: NEGATIVE

## 2017-01-05 MED ORDER — CYCLOBENZAPRINE HCL 10 MG PO TABS: 10.0000 mg | ORAL_TABLET | Freq: Three times a day (TID) | ORAL | 0 refills | Status: DC | PRN

## 2017-01-05 MED ORDER — HYDROCODONE-ACETAMINOPHEN 5-325 MG PO TABS: 1.0000 | ORAL_TABLET | ORAL | 0 refills | Status: DC | PRN
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
## 2017-01-05 NOTE — Discharge Instructions (Signed)
Read the information below.  Use the prescribed medication as directed.  Please discuss all new medications with your pharmacist.  Do not take additional tylenol while taking the prescribed pain medication to avoid overdose.  You may return to the Emergency Department at any time for worsening condition or any new symptoms that concern you.    °

## 2017-01-08 DIAGNOSIS — S301XXA Contusion of abdominal wall, initial encounter: Secondary | ICD-10-CM | POA: Diagnosis not present

## 2017-01-08 DIAGNOSIS — S060X1D Concussion with loss of consciousness of 30 minutes or less, subsequent encounter: Secondary | ICD-10-CM | POA: Diagnosis not present

## 2017-01-10 ENCOUNTER — Other Ambulatory Visit: Payer: Self-pay | Admitting: Physician Assistant

## 2017-01-10 DIAGNOSIS — S301XXA Contusion of abdominal wall, initial encounter: Secondary | ICD-10-CM

## 2017-01-15 DIAGNOSIS — S301XXA Contusion of abdominal wall, initial encounter: Secondary | ICD-10-CM | POA: Diagnosis not present

## 2017-01-15 DIAGNOSIS — M542 Cervicalgia: Secondary | ICD-10-CM | POA: Diagnosis not present

## 2017-01-15 DIAGNOSIS — S060X1D Concussion with loss of consciousness of 30 minutes or less, subsequent encounter: Secondary | ICD-10-CM | POA: Diagnosis not present

## 2017-01-16 ENCOUNTER — Ambulatory Visit
Admission: RE | Admit: 2017-01-16 | Discharge: 2017-01-16 | Disposition: A | Discharge status: Home / Self Care | Payer: 59 | Source: Ambulatory Visit | Attending: Physician Assistant | Admitting: Physician Assistant

## 2017-01-16 DIAGNOSIS — S301XXA Contusion of abdominal wall, initial encounter: Secondary | ICD-10-CM | POA: Diagnosis not present

## 2017-01-16 DIAGNOSIS — S3011XA Contusion of abdominal wall, initial encounter: Secondary | ICD-10-CM

## 2017-01-16 DIAGNOSIS — R51 Headache: Secondary | ICD-10-CM | POA: Diagnosis not present

## 2017-01-16 MED ORDER — IOPAMIDOL (ISOVUE-300) INJECTION 61%: 100.0000 mL | Freq: Once | INTRAVENOUS | Status: DC | PRN

## 2017-01-30 ENCOUNTER — Encounter: Payer: Self-pay | Admitting: Neurology

## 2017-02-07 ENCOUNTER — Ambulatory Visit (INDEPENDENT_AMBULATORY_CARE_PROVIDER_SITE_OTHER): Payer: 59 | Admitting: Neurology

## 2017-02-07 ENCOUNTER — Encounter: Payer: Self-pay | Admitting: Neurology

## 2017-02-07 VITALS — BP 116/64 | HR 95 | Ht 69.5 in | Wt 176.7 lb

## 2017-02-07 DIAGNOSIS — F0781 Postconcussional syndrome: Secondary | ICD-10-CM | POA: Diagnosis not present

## 2017-02-07 MED ORDER — NORTRIPTYLINE HCL 25 MG PO CAPS: 25.0000 mg | ORAL_CAPSULE | Freq: Every day | ORAL | 3 refills | Status: DC

## 2017-02-07 NOTE — Patient Instructions (Signed)
1.  We will start nortriptyline 25mg  at bedtime.  It is an antidepressant that is very effective in reducing frequency of headaches.  Contact me in 4 weeks with update and we can increase dose if needed.  2.  To help reduce HEADACHES, I also want to take the following (over the counter): Coenzyme Q10 160mg  ONCE DAILY Riboflavin/Vitamin B2 400mg  ONCE DAILY Magnesium oxide 400mg  ONCE - TWICE DAILY May stop after headaches are resolved.                                                                                              3.  Limit use of ibuprofen to no more than 2 days out of the week.  You should limit use of pain relievers to no more than 2 days out of the week to prevent daily rebound headache.  Stop tramadol.  Continue cyclobenzaprine at bedtime for neck spasms/neck pain.  May take when you get a severe headache as well (limit use to no more than three times a day, caution for drowsiness).  4.  To help decrease inflammation, I want you to take Turmeric 500mg  twice daily.  5.  I want you to be active.  I think you may go back to work.  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  If needed, try to avoid working at the front desk where you need to interact with customers (until you feel better).  6.  Follow up in 2 months.
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
## 2017-04-02 DIAGNOSIS — R319 Hematuria, unspecified: Secondary | ICD-10-CM | POA: Diagnosis not present

## 2017-04-02 DIAGNOSIS — R102 Pelvic and perineal pain: Secondary | ICD-10-CM | POA: Diagnosis not present

## 2017-04-03 ENCOUNTER — Ambulatory Visit: Payer: 59 | Admitting: Neurology

## 2017-04-04 ENCOUNTER — Ambulatory Visit (INDEPENDENT_AMBULATORY_CARE_PROVIDER_SITE_OTHER): Payer: 59 | Admitting: Neurology

## 2017-04-04 ENCOUNTER — Encounter: Payer: Self-pay | Admitting: Neurology

## 2017-04-04 VITALS — BP 120/80 | HR 88 | Ht 68.5 in | Wt 172.0 lb

## 2017-04-04 DIAGNOSIS — G44309 Post-traumatic headache, unspecified, not intractable: Secondary | ICD-10-CM | POA: Diagnosis not present

## 2017-04-04 DIAGNOSIS — F0781 Postconcussional syndrome: Secondary | ICD-10-CM | POA: Diagnosis not present
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
## 2017-04-04 NOTE — Patient Instructions (Signed)
1.  Stop the nortriptyline 2.  When you get a headache, take ibuprofen but limit to no more than 2 days out of the week. 3.  Follow up as needed.

## 2017-04-05 NOTE — Progress Notes (Signed)
Rosanne Sack, RN is inactive.  I will call patient to find out her PCP and fax note to them.

## 2017-07-30 DIAGNOSIS — Z01411 Encounter for gynecological examination (general) (routine) with abnormal findings: Secondary | ICD-10-CM | POA: Diagnosis not present

## 2017-12-10 ENCOUNTER — Other Ambulatory Visit: Payer: Self-pay | Admitting: Obstetrics and Gynecology

## 2017-12-10 DIAGNOSIS — Z1231 Encounter for screening mammogram for malignant neoplasm of breast: Secondary | ICD-10-CM

## 2017-12-25 DIAGNOSIS — N6310 Unspecified lump in the right breast, unspecified quadrant: Secondary | ICD-10-CM | POA: Diagnosis not present

## 2018-01-07 DIAGNOSIS — N6310 Unspecified lump in the right breast, unspecified quadrant: Secondary | ICD-10-CM | POA: Diagnosis not present

## 2018-01-21 ENCOUNTER — Other Ambulatory Visit: Payer: Self-pay | Admitting: Surgery

## 2018-01-21 DIAGNOSIS — N6310 Unspecified lump in the right breast, unspecified quadrant: Secondary | ICD-10-CM

## 2018-01-29 ENCOUNTER — Ambulatory Visit
Admission: RE | Admit: 2018-01-29 | Discharge: 2018-01-29 | Disposition: A | Discharge status: Home / Self Care | Payer: 59 | Source: Ambulatory Visit | Attending: Surgery | Admitting: Surgery

## 2018-01-29 DIAGNOSIS — R922 Inconclusive mammogram: Secondary | ICD-10-CM | POA: Diagnosis not present

## 2018-01-29 DIAGNOSIS — N6310 Unspecified lump in the right breast, unspecified quadrant: Secondary | ICD-10-CM

## 2018-01-29 DIAGNOSIS — N6314 Unspecified lump in the right breast, lower inner quadrant: Secondary | ICD-10-CM | POA: Diagnosis not present

## 2018-03-05 DIAGNOSIS — K130 Diseases of lips: Secondary | ICD-10-CM | POA: Diagnosis not present

## 2018-03-05 DIAGNOSIS — M25511 Pain in right shoulder: Secondary | ICD-10-CM | POA: Diagnosis not present

## 2018-03-05 DIAGNOSIS — R079 Chest pain, unspecified: Secondary | ICD-10-CM | POA: Diagnosis not present

## 2018-08-06 DIAGNOSIS — Z01411 Encounter for gynecological examination (general) (routine) with abnormal findings: Secondary | ICD-10-CM | POA: Diagnosis not present

## 2018-08-06 DIAGNOSIS — R399 Unspecified symptoms and signs involving the genitourinary system: Secondary | ICD-10-CM | POA: Diagnosis not present

## 2018-08-06 DIAGNOSIS — Z6825 Body mass index (BMI) 25.0-25.9, adult: Secondary | ICD-10-CM | POA: Diagnosis not present

## 2019-06-27 ENCOUNTER — Other Ambulatory Visit: Payer: Self-pay | Admitting: Obstetrics and Gynecology

## 2019-06-27 DIAGNOSIS — Z1231 Encounter for screening mammogram for malignant neoplasm of breast: Secondary | ICD-10-CM

## 2019-08-13 ENCOUNTER — Other Ambulatory Visit: Payer: Self-pay

## 2019-08-13 ENCOUNTER — Ambulatory Visit
Admission: RE | Admit: 2019-08-13 | Discharge: 2019-08-13 | Disposition: A | Discharge status: Home / Self Care | Payer: 59 | Source: Ambulatory Visit | Attending: Obstetrics and Gynecology | Admitting: Obstetrics and Gynecology

## 2019-08-13 ENCOUNTER — Ambulatory Visit: Payer: 59

## 2019-08-13 DIAGNOSIS — Z1231 Encounter for screening mammogram for malignant neoplasm of breast: Secondary | ICD-10-CM

## 2020-07-02 ENCOUNTER — Other Ambulatory Visit: Payer: Self-pay | Admitting: Obstetrics and Gynecology

## 2020-07-02 DIAGNOSIS — N6459 Other signs and symptoms in breast: Secondary | ICD-10-CM

## 2020-07-02 DIAGNOSIS — N63 Unspecified lump in unspecified breast: Secondary | ICD-10-CM

## 2020-07-07 ENCOUNTER — Other Ambulatory Visit: Payer: Self-pay | Admitting: Obstetrics and Gynecology

## 2020-07-07 DIAGNOSIS — G8929 Other chronic pain: Secondary | ICD-10-CM

## 2020-07-07 DIAGNOSIS — R109 Unspecified abdominal pain: Secondary | ICD-10-CM

## 2020-07-16 ENCOUNTER — Ambulatory Visit
Admission: RE | Admit: 2020-07-16 | Discharge: 2020-07-16 | Disposition: A | Discharge status: Home / Self Care | Payer: 59 | Source: Ambulatory Visit | Attending: Obstetrics and Gynecology | Admitting: Obstetrics and Gynecology

## 2020-07-16 ENCOUNTER — Other Ambulatory Visit: Payer: Self-pay

## 2020-07-16 ENCOUNTER — Other Ambulatory Visit: Payer: Self-pay | Admitting: Obstetrics and Gynecology

## 2020-07-16 DIAGNOSIS — N6459 Other signs and symptoms in breast: Secondary | ICD-10-CM

## 2020-07-16 DIAGNOSIS — N63 Unspecified lump in unspecified breast: Secondary | ICD-10-CM

## 2020-07-16 DIAGNOSIS — N6001 Solitary cyst of right breast: Secondary | ICD-10-CM

## 2020-07-16 IMAGING — MG MM DIGITAL DIAGNOSTIC UNILAT*R* W/ TOMO W/ CAD
6 series · 8 of 18 positions shown · non-contrast
Comparison: Previous exam(s).

CLINICAL DATA: 44-year-old with known BILATERAL breast cysts,
presenting with an enlarging mass in the UPPER subareolar location
of the RIGHT breast which is associated with intermittent pain.
Possible palpable lump in the INNER RIGHT breast at recent clinical
evaluation.

EXAM:
DIGITAL DIAGNOSTIC RIGHT MAMMOGRAM WITH CAD AND TOMO
ULTRASOUND RIGHT BREAST

[R CC synth-2D]
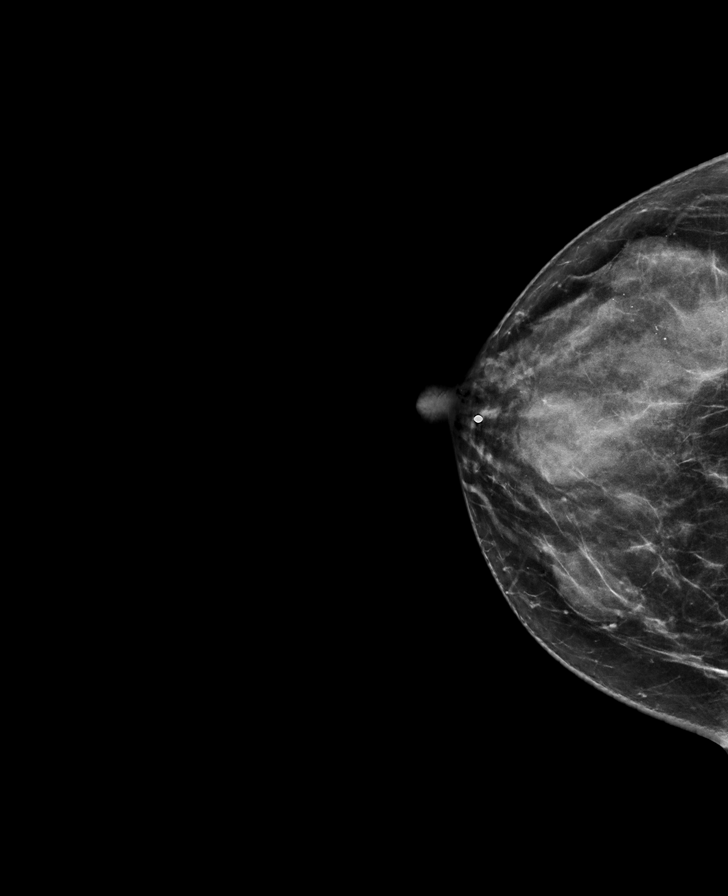

[R MLO synth-2D]
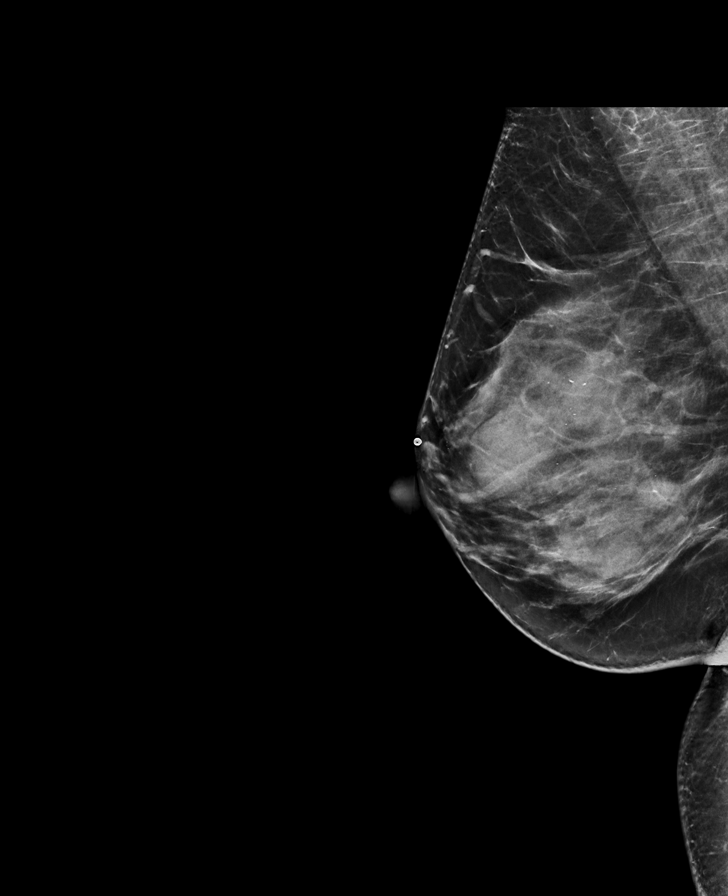

[R TAN synth-2D]
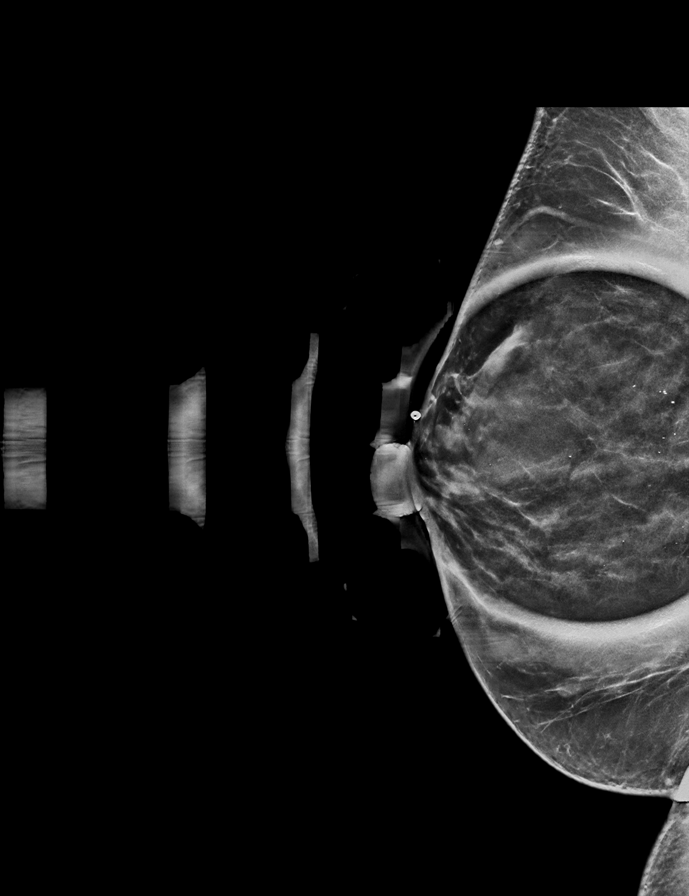

[R MLO tomo · 3 of 71 frames shown]
[frame 23/71]
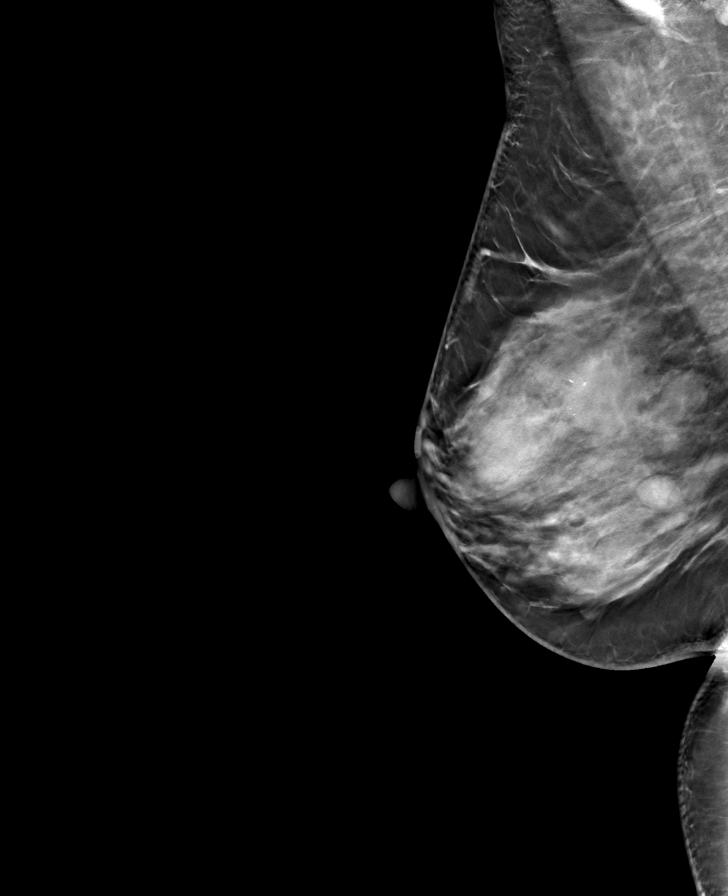
[frame 36/71]
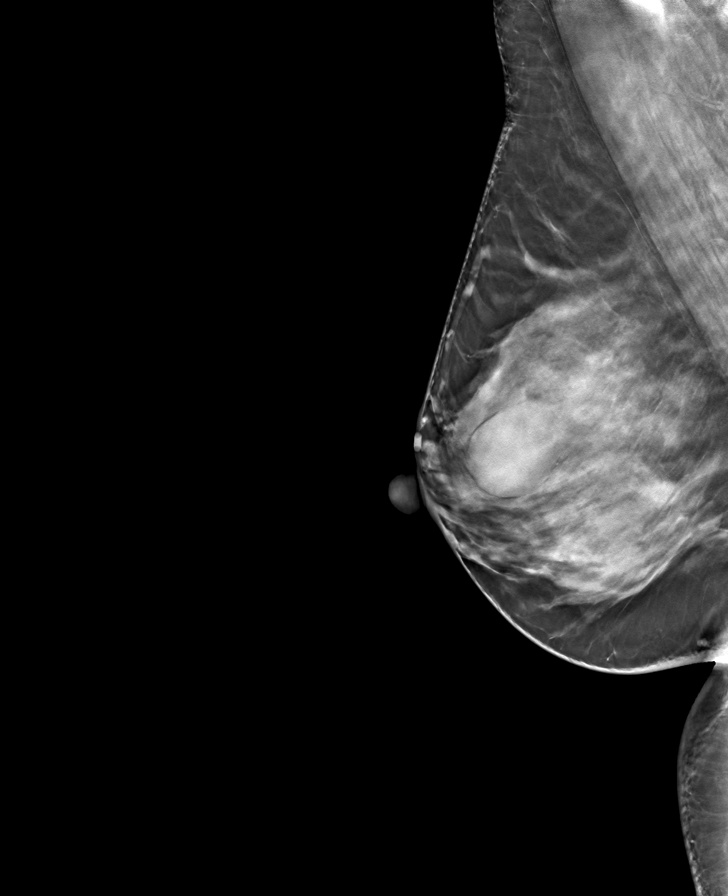
[frame 49/71]
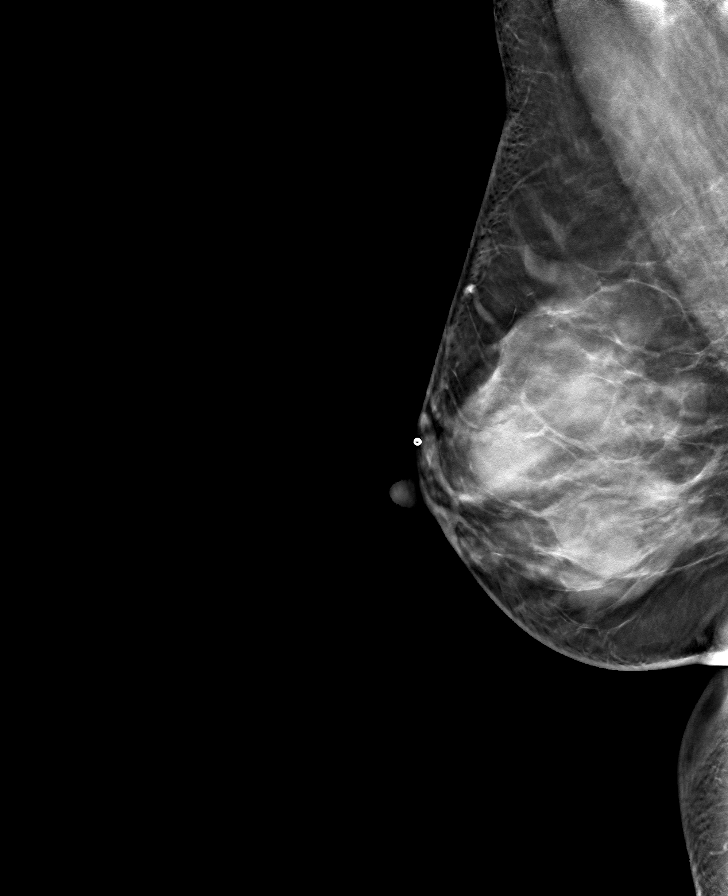

[R CC tomo · tomo slice 33/65.0]
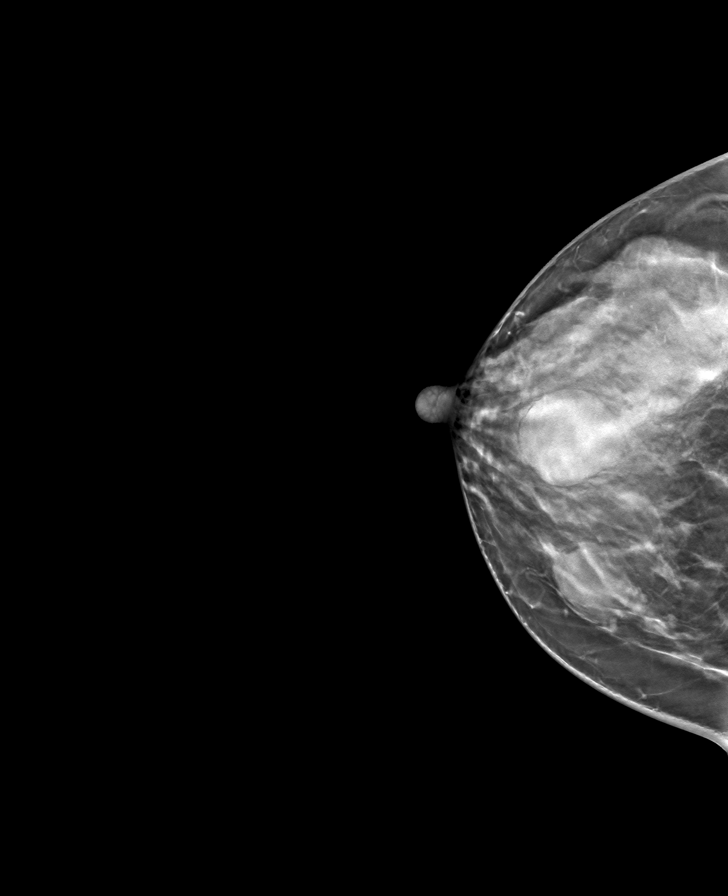

[R TAN tomo · tomo slice 30/59.0]
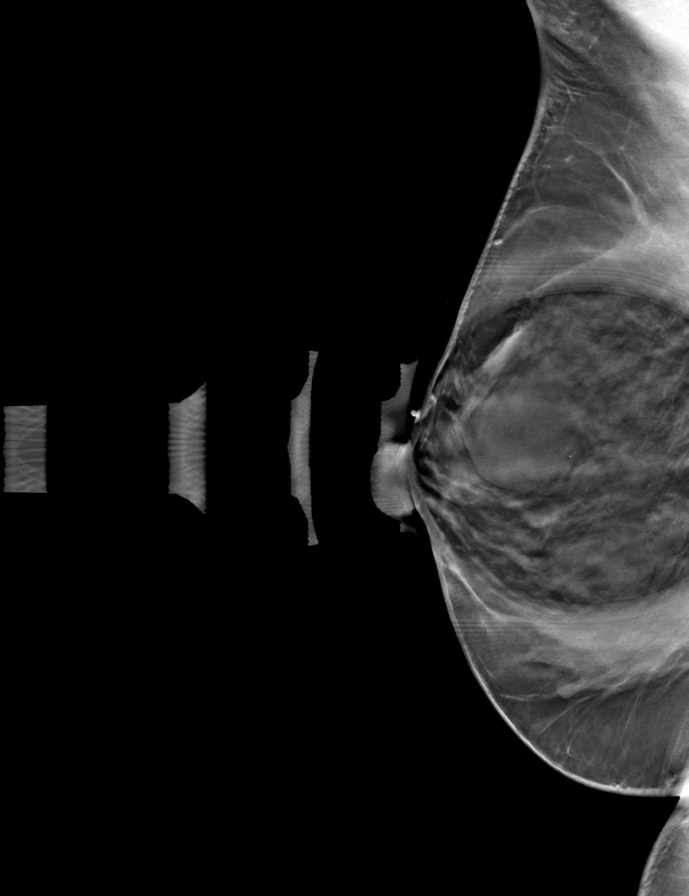

[8 of 18 positions shown; findings below may reference images not displayed]

ACR Breast Density Category d: The breast tissue is extremely dense,
which lowers the sensitivity of mammography.
FINDINGS: Tomosynthesis and synthesized full field CC and MLO views of the
RIGHT breast were obtained. Tomosynthesis and synthesized spot
compression tangential view of the area of concern in the RIGHT
breast was also obtained.

Corresponding to the palpable concern in the UPPER subareolar
location is a partially obscured isodense mass whose visible margins
are circumscribed, measuring approximately 2.5-3 cm. There is no
associated architectural distortion or suspicious calcifications.

Similar partially obscured masses with circumscribed visible margins
are present throughout the breast, the largest in the INNER breast
at MIDDLE depth also measuring approximately 3 cm. There is no
associated architectural distortion or suspicious calcifications
with any of the other masses.

These masses correspond to the multiple cysts identified on the
prior ultrasound in [DATE].

No findings suspicious for malignancy in the RIGHT breast.

Mammographic images were processed with CAD.

On correlative physical exam, there is a freely mobile palpable
approximate 2-3 cm mass in the UPPER periareolar location
corresponding what the patient is feeling. The patient describes
tenderness to palpation.

Targeted RIGHT ultrasound is performed, showing a benign simple cyst
at the 12 o'clock position approximately 1 cm from the nipple
measuring approximately 3.0 x 1.9 x 2.6 cm, demonstrating posterior
acoustic enhancement and no internal power Doppler flow,
corresponding to the palpable concern.

At the 3 o'clock position approximately 2 cm from nipple is a benign
simple cyst measuring approximately 2.3 x 1.5 x 2.2 cm,
demonstrating posterior acoustic enhancement and no internal power
Doppler flow, corresponding to the mammographic finding
IMPRESSION: 1. Benign 3 cm simple cyst in the UPPER periareolar RIGHT breast
which accounts for the palpable concern.
2. Benign 2.3 cm simple cyst in the INNER RIGHT breast which
accounts for a mammographic finding.

RECOMMENDATION:
1. Because the palpable cyst in the UPPER periareolar location is
symptomatic, the patient requests cyst aspiration, and she would
like to have cyst in the INNER breast aspirated as well. This has
been scheduled at her convenience.
2. Annual BILATERAL screening mammography which is due in 1 month.

I have discussed the findings and recommendations with the patient.
If applicable, a reminder letter will be sent to the patient
regarding the next appointment.

BI-RADS CATEGORY  2: Benign.

## 2020-07-20 ENCOUNTER — Other Ambulatory Visit: Payer: Self-pay

## 2020-07-20 ENCOUNTER — Ambulatory Visit
Admission: RE | Admit: 2020-07-20 | Discharge: 2020-07-20 | Disposition: A | Discharge status: Home / Self Care | Payer: 59 | Source: Ambulatory Visit | Attending: Obstetrics and Gynecology | Admitting: Obstetrics and Gynecology

## 2020-07-20 DIAGNOSIS — G8929 Other chronic pain: Secondary | ICD-10-CM

## 2020-07-20 DIAGNOSIS — R109 Unspecified abdominal pain: Secondary | ICD-10-CM

## 2020-07-20 MED ORDER — IOPAMIDOL (ISOVUE-300) INJECTION 61%
100.0000 mL | Freq: Once | INTRAVENOUS | Status: AC | PRN
  Administered 2020-07-20: 100 mL via INTRAVENOUS

## 2020-07-27 ENCOUNTER — Ambulatory Visit
Admission: RE | Admit: 2020-07-27 | Discharge: 2020-07-27 | Disposition: A | Discharge status: Home / Self Care | Payer: 59 | Source: Ambulatory Visit | Attending: Obstetrics and Gynecology | Admitting: Obstetrics and Gynecology

## 2020-07-27 ENCOUNTER — Other Ambulatory Visit: Payer: Self-pay | Admitting: Obstetrics and Gynecology

## 2020-07-27 ENCOUNTER — Other Ambulatory Visit: Payer: Self-pay

## 2020-07-27 DIAGNOSIS — N6001 Solitary cyst of right breast: Secondary | ICD-10-CM

## 2020-07-28 ENCOUNTER — Other Ambulatory Visit: Payer: Self-pay | Admitting: Urology

## 2020-09-03 ENCOUNTER — Encounter (HOSPITAL_BASED_OUTPATIENT_CLINIC_OR_DEPARTMENT_OTHER): Payer: Self-pay | Admitting: Urology

## 2020-09-03 ENCOUNTER — Other Ambulatory Visit: Payer: Self-pay

## 2020-09-03 NOTE — Progress Notes (Signed)
Spoke w/ via phone for pre-op interview--- PT Lab needs dos---- [place] and EKG (ask [facility] dos if ekg needed, order placed)              Lab results------ no COVID test ------ 09-10-2020 @ 1415 Arrive at ------- 0700 NPO after MN  Medications to take morning of surgery ----- NONE Diabetic medication ----- n/a Patient Special Instructions ----- n/a Pre-Op special Istructions ----- pt coming in earlier than usual due to dr Matilde Sprang having two previous cases and he will being go quickly with them Patient verbalized understanding of instructions that were given at this phone interview. Patient denies shortness of breath, chest pain, fever, cough at this phone interview.

## 2020-09-09 NOTE — H&P (Signed)
I was consulted to assess the patient's left lower quadrant pain that she has had daily for 3 months. Pain is present 24 hours a day and is less than the little bit lately. It is worse with movement. She describes being treated twice with an antibiotic for a possible bladder infection. No change in bowel function   Voids every 2 hours but drinks a lot of fluid. Gets up once or twice a night. Voids with good flow and feels empty.   She denies history kidney stones previous GU surgery and bladder infections. Has had a hysterectomy. No neurologic issues.       ALLERGIES: Naproxen TABS    MEDICATIONS: Benadryl TABS Oral  Ibuprofen CAPS Oral     GU PSH: D&C Non-OB - 2017       PSH Notes: Oral Surgery Tooth Extraction, Dilation And Curettage   NON-GU PSH: Dental Surgery Procedure - 2017     GU PMH: Other microscopic hematuria, Hematuria, microscopic - 2017 Pelvic/perineal pain, Female pelvic pain - 2017, Abdominal pain, suprapubic, - 2017    NON-GU PMH: Anxiety, Anxiety - 2017 Encounter for general adult medical examination without abnormal findings, Encounter for preventive health examination - 2017 Personal history of other mental and behavioral disorders, History of depression - 2017    FAMILY HISTORY: Hypertension - Runs In Family malignant neoplasm of breast - Runs In Family Prostate Cancer - Runs In Family   SOCIAL HISTORY: No Social History     Notes: Alcohol use, Married, Never a smoker, Caffeine use   REVIEW OF SYSTEMS:    GU Review Female:   Patient denies frequent urination, hard to postpone urination, burning /pain with urination, get up at night to urinate, leakage of urine, stream starts and stops, trouble starting your stream, have to strain to urinate, and being pregnant.  Gastrointestinal (Upper):   Patient denies nausea, vomiting, and indigestion/ heartburn.  Gastrointestinal (Lower):   Patient denies diarrhea and constipation.  Constitutional:   Patient  denies night sweats, fever, fatigue, and weight loss.  Skin:   Patient denies skin rash/ lesion and itching.  Eyes:   Patient denies blurred vision and double vision.  Ears/ Nose/ Throat:   Patient denies sore throat and sinus problems.  Hematologic/Lymphatic:   Patient denies swollen glands and easy bruising.  Cardiovascular:   Patient denies leg swelling and chest pains.  Respiratory:   Patient denies cough and shortness of breath.  Endocrine:   Patient denies excessive thirst.  Musculoskeletal:   Patient denies back pain and joint pain.  Neurological:   Patient denies headaches and dizziness.  Psychologic:   Patient denies depression and anxiety.   VITAL SIGNS:      07/27/2020 08:55 AM  Weight 176 lb / 79.83 kg  Height 68 in / 172.72 cm  BP 135/85 mmHg  Pulse 87 /min  Temperature 97.5 F / 36.3 C  BMI 26.8 kg/m   GU PHYSICAL EXAMINATION:    Vagina: No bladder or CVA tenderness.    MULTI-SYSTEM PHYSICAL EXAMINATION:    Constitutional: Well-nourished. No physical deformities. Normally developed. Good grooming.  Neck: Neck symmetrical, not swollen. Normal tracheal position.  Respiratory: No labored breathing, no use of accessory muscles.   Cardiovascular: Normal temperature, normal extremity pulses, no swelling, no varicosities.  Lymphatic: No enlargement of neck, axillae, groin.  Skin: No paleness, no jaundice, no cyanosis. No lesion, no ulcer, no rash.  Neurologic / Psychiatric: Oriented to time, oriented to place, oriented to person. No depression,  no anxiety, no agitation.  Gastrointestinal: No mass, no tenderness, no rigidity, non obese abdomen.  Eyes: Normal conjunctivae. Normal eyelids.  Ears, Nose, Mouth, and Throat: Left ear no scars, no lesions, no masses. Right ear no scars, no lesions, no masses. Nose no scars, no lesions, no masses. Normal hearing. Normal lips.  Musculoskeletal: Normal gait and station of head and neck.     PAST DATA REVIEW: None   PROCEDURES:          PVR Ultrasound - 33007  Scanned Volume: 0 cc         Urinalysis Dipstick Dipstick Cont'd  Color: Yellow Bilirubin: Neg mg/dL  Appearance: Clear Ketones: Neg mg/dL  Specific Gravity: 1.015 Blood: Neg ery/uL  pH: 6.5 Protein: Neg mg/dL  Glucose: Neg mg/dL Urobilinogen: 0.2 mg/dL    Nitrites: Neg    Leukocyte Esterase: Neg leu/uL    ASSESSMENT:      ICD-10 Details  1 GU:   Urinary Frequency - R35.0   2   Nocturia - R35.1           Notes:   I was consulted to assess the patient's left lower quadrant pain that she has had daily for 3 months. Pain is present 24 hours a day and is less than the little bit lately. It is worse with movement. She describes being treated twice with an antibiotic for a possible bladder infection. No change in bowel function   Voids every 2 hours but drinks a lot of fluid. Gets up once or twice a night. Voids with good flow and feels empty. no dyspareunia   Patient saw my partner in 2017 for pelvic discomfort and had a negative workup for microscopic hematuria. The patient has had lysis of adhesions by gynecology in the past. I reviewed the medical record in referral notes and she had a trace of blood and no culture sent. patient had CT scan with contrast 1 week ago with no abnormalities noted   No bladder or CVA tenderness.   chart reviewed Urine reviewed. Urine sent for culture.   patient has left lower quadrant pain. She does not have other symptoms of interstitial cystitis. My index of suspicion is low that she has this. I talked about a hydrodistention.   The patient understands that they she/he could have interstitial cystitis. We talked about the role of cystoscopy/hydrodistension and instillation in detail. Pros, cons, general surgical and anesthetic risks, and other options including watchful waiting were discussed. Risks were described but not limited to pain, infection, and bleeding. The risk of bladder perforation and management were discussed.  The patient understands that it is primarily a diagnostic procedure.   patient would like to proceed with hydrodistention because of the chronicity and ongoing nature of the symptoms. Call if urine culture is positive      After a thorough review of the management options for the patient's condition the patient  elected to proceed with surgical therapy as noted above. We have discussed the potential benefits and risks of the procedure, side effects of the proposed treatment, the likelihood of the patient achieving the goals of the procedure, and any potential problems that might occur during the procedure or recuperation. Informed consent has been obtained.

## 2020-09-10 ENCOUNTER — Other Ambulatory Visit (HOSPITAL_COMMUNITY)
Admission: RE | Admit: 2020-09-10 | Discharge: 2020-09-10 | Disposition: A | Discharge status: Home / Self Care | Payer: 59 | Source: Ambulatory Visit | Attending: Urology | Admitting: Urology

## 2020-09-10 DIAGNOSIS — Z20822 Contact with and (suspected) exposure to covid-19: Secondary | ICD-10-CM | POA: Diagnosis not present

## 2020-09-10 DIAGNOSIS — Z01812 Encounter for preprocedural laboratory examination: Secondary | ICD-10-CM | POA: Diagnosis present

## 2020-09-11 LAB — SARS CORONAVIRUS 2 (TAT 6-24 HRS): SARS Coronavirus 2: NEGATIVE

## 2020-09-14 ENCOUNTER — Ambulatory Visit (HOSPITAL_BASED_OUTPATIENT_CLINIC_OR_DEPARTMENT_OTHER): Payer: 59 | Admitting: Anesthesiology

## 2020-09-14 ENCOUNTER — Encounter (HOSPITAL_BASED_OUTPATIENT_CLINIC_OR_DEPARTMENT_OTHER)
Admission: RE | Disposition: A | Discharge status: Home / Self Care | Payer: Self-pay | Source: Home / Self Care | Attending: Urology

## 2020-09-14 ENCOUNTER — Encounter (HOSPITAL_BASED_OUTPATIENT_CLINIC_OR_DEPARTMENT_OTHER): Payer: Self-pay | Admitting: Urology

## 2020-09-14 ENCOUNTER — Other Ambulatory Visit: Payer: Self-pay

## 2020-09-14 ENCOUNTER — Ambulatory Visit (HOSPITAL_BASED_OUTPATIENT_CLINIC_OR_DEPARTMENT_OTHER)
Admission: RE | Admit: 2020-09-14 | Discharge: 2020-09-14 | Disposition: A | Discharge status: Home / Self Care | Payer: 59 | Attending: Urology | Admitting: Urology

## 2020-09-14 DIAGNOSIS — N3010 Interstitial cystitis (chronic) without hematuria: Secondary | ICD-10-CM | POA: Diagnosis not present

## 2020-09-14 HISTORY — DX: Prediabetes: R73.03

## 2020-09-14 HISTORY — DX: Migraine, unspecified, not intractable, without status migrainosus: G43.909

## 2020-09-14 HISTORY — DX: Dermatitis, unspecified: L30.9

## 2020-09-14 HISTORY — DX: Nocturia: R35.1

## 2020-09-14 HISTORY — PX: CYSTO WITH HYDRODISTENSION: SHX5453

## 2020-09-14 HISTORY — DX: Family history of other specified conditions: Z84.89

## 2020-09-14 HISTORY — DX: Presence of spectacles and contact lenses: Z97.3

## 2020-09-14 LAB — POCT I-STAT, CHEM 8
BUN: 8 mg/dL (ref 6–20)
Calcium, Ion: 1.21 mmol/L (ref 1.15–1.40)
Chloride: 104 mmol/L (ref 98–111)
Creatinine, Ser: 0.8 mg/dL (ref 0.44–1.00)
Glucose, Bld: 98 mg/dL (ref 70–99)
HCT: 44 % (ref 36.0–46.0)
Hemoglobin: 15 g/dL (ref 12.0–15.0)
Potassium: 3.8 mmol/L (ref 3.5–5.1)
Sodium: 140 mmol/L (ref 135–145)
TCO2: 22 mmol/L (ref 22–32)

## 2020-09-14 SURGERY — CYSTOSCOPY, WITH BLADDER HYDRODISTENSION
Anesthesia: General

## 2020-09-14 MED ORDER — DEXAMETHASONE SODIUM PHOSPHATE 4 MG/ML IJ SOLN
INTRAMUSCULAR | Status: DC | PRN
  Administered 2020-09-14: 10 mg via INTRAVENOUS

## 2020-09-14 MED ORDER — MEPERIDINE HCL 25 MG/ML IJ SOLN: 6.2500 mg | INTRAMUSCULAR | Status: DC | PRN

## 2020-09-14 MED ORDER — STERILE WATER FOR IRRIGATION IR SOLN
Status: DC | PRN
  Administered 2020-09-14: 1500 mL

## 2020-09-14 MED ORDER — LIDOCAINE 2% (20 MG/ML) 5 ML SYRINGE
INTRAMUSCULAR | Status: AC
  Filled 2020-09-14: qty 5

## 2020-09-14 MED ORDER — DEXAMETHASONE SODIUM PHOSPHATE 10 MG/ML IJ SOLN
INTRAMUSCULAR | Status: AC
  Filled 2020-09-14: qty 1

## 2020-09-14 MED ORDER — CIPROFLOXACIN IN D5W 400 MG/200ML IV SOLN
INTRAVENOUS | Status: AC
  Filled 2020-09-14: qty 200

## 2020-09-14 MED ORDER — FENTANYL CITRATE (PF) 100 MCG/2ML IJ SOLN
INTRAMUSCULAR | Status: DC | PRN
  Administered 2020-09-14: 100 ug via INTRAVENOUS

## 2020-09-14 MED ORDER — HYDROCODONE-ACETAMINOPHEN 5-325 MG PO TABS
1.0000 | ORAL_TABLET | Freq: Four times a day (QID) | ORAL | 0 refills | Status: DC | PRN
Start: 2020-09-14 — End: 2021-09-13

## 2020-09-14 MED ORDER — ONDANSETRON HCL 4 MG/2ML IJ SOLN: 4.0000 mg | Freq: Once | INTRAMUSCULAR | Status: DC | PRN

## 2020-09-14 MED ORDER — KETOROLAC TROMETHAMINE 30 MG/ML IJ SOLN: INTRAMUSCULAR | Status: DC | PRN

## 2020-09-14 MED ORDER — MIDAZOLAM HCL 5 MG/5ML IJ SOLN
INTRAMUSCULAR | Status: DC | PRN
  Administered 2020-09-14: 2 mg via INTRAVENOUS

## 2020-09-14 MED ORDER — MIDAZOLAM HCL 2 MG/2ML IJ SOLN
INTRAMUSCULAR | Status: AC
  Filled 2020-09-14: qty 2

## 2020-09-14 MED ORDER — LACTATED RINGERS IV SOLN: INTRAVENOUS | Status: DC

## 2020-09-14 MED ORDER — BUPIVACAINE HCL 0.5 % IJ SOLN
INTRAMUSCULAR | Status: DC | PRN
  Administered 2020-09-14: 15 mL

## 2020-09-14 MED ORDER — PROPOFOL 10 MG/ML IV BOLUS
INTRAVENOUS | Status: AC
  Filled 2020-09-14: qty 20

## 2020-09-14 MED ORDER — FENTANYL CITRATE (PF) 100 MCG/2ML IJ SOLN
INTRAMUSCULAR | Status: AC
Start: 2020-09-14 — End: ?
  Filled 2020-09-14: qty 2

## 2020-09-14 MED ORDER — LIDOCAINE HCL (CARDIAC) PF 100 MG/5ML IV SOSY
PREFILLED_SYRINGE | INTRAVENOUS | Status: DC | PRN
  Administered 2020-09-14: 100 mg via INTRAVENOUS

## 2020-09-14 MED ORDER — ONDANSETRON HCL 4 MG/2ML IJ SOLN
INTRAMUSCULAR | Status: AC
  Filled 2020-09-14: qty 2

## 2020-09-14 MED ORDER — ACETAMINOPHEN 160 MG/5ML PO SOLN: 325.0000 mg | ORAL | Status: DC | PRN

## 2020-09-14 MED ORDER — ONDANSETRON HCL 4 MG/2ML IJ SOLN
INTRAMUSCULAR | Status: DC | PRN
  Administered 2020-09-14: 4 mg via INTRAVENOUS

## 2020-09-14 MED ORDER — OXYCODONE HCL 5 MG/5ML PO SOLN: 5.0000 mg | Freq: Once | ORAL | Status: DC | PRN

## 2020-09-14 MED ORDER — PHENAZOPYRIDINE HCL 200 MG PO TABS: ORAL | Status: DC | PRN

## 2020-09-14 MED ORDER — CIPROFLOXACIN IN D5W 400 MG/200ML IV SOLN
400.0000 mg | INTRAVENOUS | Status: AC
  Administered 2020-09-14: 400 mg via INTRAVENOUS

## 2020-09-14 MED ORDER — OXYCODONE HCL 5 MG PO TABS: 5.0000 mg | ORAL_TABLET | Freq: Once | ORAL | Status: DC | PRN

## 2020-09-14 MED ORDER — ACETAMINOPHEN 10 MG/ML IV SOLN: 1000.0000 mg | Freq: Once | INTRAVENOUS | Status: DC | PRN

## 2020-09-14 MED ORDER — FENTANYL CITRATE (PF) 100 MCG/2ML IJ SOLN: 25.0000 ug | INTRAMUSCULAR | Status: DC | PRN

## 2020-09-14 MED ORDER — PROPOFOL 10 MG/ML IV BOLUS
INTRAVENOUS | Status: DC | PRN
  Administered 2020-09-14: 200 mg via INTRAVENOUS

## 2020-09-14 MED ORDER — ACETAMINOPHEN 325 MG PO TABS: 325.0000 mg | ORAL_TABLET | ORAL | Status: DC | PRN

## 2020-09-14 SURGICAL SUPPLY — 24 items
BAG DRAIN URO-CYSTO SKYTR STRL (DRAIN) ×3 IMPLANT
BAG DRN UROCATH (DRAIN) ×1
CATH FOLEY 2WAY SLVR  5CC 18FR (CATHETERS)
CATH FOLEY 2WAY SLVR 5CC 18FR (CATHETERS) IMPLANT
CATH ROBINSON RED A/P 12FR (CATHETERS) IMPLANT
CATH ROBINSON RED A/P 14FR (CATHETERS) ×3 IMPLANT
CLOTH BEACON ORANGE TIMEOUT ST (SAFETY) ×3 IMPLANT
ELECT REM PT RETURN 9FT ADLT (ELECTROSURGICAL)
ELECTRODE REM PT RTRN 9FT ADLT (ELECTROSURGICAL) IMPLANT
GLOVE BIO SURGEON STRL SZ7 (GLOVE) ×6 IMPLANT
GLOVE BIO SURGEON STRL SZ7.5 (GLOVE) ×3 IMPLANT
GOWN STRL REUS W/ TWL XL LVL3 (GOWN DISPOSABLE) ×1 IMPLANT
GOWN STRL REUS W/TWL XL LVL3 (GOWN DISPOSABLE) ×3
KIT TURNOVER CYSTO (KITS) ×3 IMPLANT
MANIFOLD NEPTUNE II (INSTRUMENTS) ×3 IMPLANT
NDL SAFETY ECLIPSE 18X1.5 (NEEDLE) ×2 IMPLANT
NEEDLE HYPO 18GX1.5 SHARP (NEEDLE) ×6
PACK CYSTO (CUSTOM PROCEDURE TRAY) ×3 IMPLANT
SUT SILK 0 TIES 10X30 (SUTURE) IMPLANT
SYR 20ML LL LF (SYRINGE) ×3 IMPLANT
SYR 30ML LL (SYRINGE) ×3 IMPLANT
TUBE CONNECTING 12'X1/4 (SUCTIONS)
TUBE CONNECTING 12X1/4 (SUCTIONS) IMPLANT
WATER STERILE IRR 3000ML UROMA (IV SOLUTION) ×3 IMPLANT

## 2020-09-14 NOTE — Anesthesia Postprocedure Evaluation (Signed)
Anesthesia Post Note  Patient: Rhonda [name]  Procedure(s) Performed: CYSTOSCOPY/HYDRODISTENSION  INSTILLATION (N/A )     Patient location during evaluation: Phase II Anesthesia Type: General Level of consciousness: awake Pain management: pain level controlled Vital Signs Assessment: post-procedure vital signs reviewed and stable Respiratory status: spontaneous breathing Cardiovascular status: stable Postop Assessment: no apparent nausea or vomiting Anesthetic complications: no   No complications documented.  Last Vitals:  Vitals:   09/14/20 1000 09/14/20 1015  BP: 132/90 132/88  Pulse: 91 76  Resp: 13 12  Temp:    SpO2: 100% 99%    Last Pain:  Vitals:   09/14/20 1030  TempSrc:   PainSc: 0-No pain   Pain Goal: Patients Stated Pain Goal: 6 (09/14/20 0744)                 Huston Foley

## 2020-09-14 NOTE — Anesthesia Preprocedure Evaluation (Signed)
Anesthesia Evaluation  Patient identified by MRN, date of birth, ID band Patient awake    Airway Mallampati: I       Dental no notable dental hx.    Pulmonary neg pulmonary ROS,    Pulmonary exam normal        Cardiovascular negative cardio ROS Normal cardiovascular exam     Neuro/Psych  Headaches, negative psych ROS   GI/Hepatic negative GI ROS, Neg liver ROS,   Endo/Other  negative endocrine ROS  Renal/GU negative Renal ROS  negative genitourinary   Musculoskeletal negative musculoskeletal ROS (+)   Abdominal Normal abdominal exam  (+)   Peds  Hematology negative hematology ROS (+)   Anesthesia Other Findings   Reproductive/Obstetrics                             Anesthesia Physical Anesthesia Plan  ASA: I  Anesthesia Plan: General   Post-op Pain Management:    Induction: Intravenous  PONV Risk Score and Plan: 4 or greater and Ondansetron, Dexamethasone and Midazolam  Airway Management Planned: LMA  Additional Equipment: None  Intra-op Plan:   Post-operative Plan: Extubation in OR  Informed Consent: I have reviewed the patients History and Physical, chart, labs and discussed the procedure including the risks, benefits and alternatives for the proposed anesthesia with the patient or authorized representative who has indicated his/her understanding and acceptance.     Dental advisory given  Plan Discussed with: CRNA  Anesthesia Plan Comments:         Anesthesia Quick Evaluation

## 2020-09-14 NOTE — Discharge Instructions (Signed)
I have reviewed discharge instructions in detail with the patient. They will follow-up with me or their physician as scheduled. My nurse will also be calling the patients as per protocol.   CYSTOSCOPY HOME CARE INSTRUCTIONS  Activity: Rest for the remainder of the day.  Do not drive or operate equipment today.  You may resume normal activities in one to two days as instructed by your physician.   Meals: Drink plenty of liquids and eat light foods such as gelatin or soup this evening.  You may return to a normal meal plan tomorrow.  Return to Work: You may return to work in one to two days or as instructed by your physician.  Special Instructions / Symptoms: Call your physician if any of these symptoms occur:   -persistent or heavy bleeding  -bleeding which continues after first few urination  -large blood clots that are difficult to pass  -urine stream diminishes or stops completely  -fever equal to or higher than 101 degrees Farenheit.  -cloudy urine with a strong, foul odor  -severe pain  Females should always wipe from front to back after elimination.  You may feel some burning pain when you urinate.  This should disappear with time.  Applying moist heat to the lower abdomen or a hot tub bath may help relieve the pain. \  Follow-Up / Date of Return Visit to Your Physician:  As instructed.  Call for an appointment to arrange follow-up.    Post Anesthesia Home Care Instructions  Activity: Get plenty of rest for the remainder of the day. A responsible individual must stay with you for 24 hours following the procedure.  For the next 24 hours, DO NOT: -Drive a car -Paediatric nurse -Drink alcoholic beverages -Take any medication unless instructed by your physician -Make any legal decisions or sign important papers.  Meals: Start with liquid foods such as gelatin or soup. Progress to regular foods as tolerated. Avoid greasy, spicy, heavy foods. If nausea and/or vomiting occur,  drink only clear liquids until the nausea and/or vomiting subsides. Call your physician if vomiting continues.  Special Instructions/Symptoms: Your throat may feel dry or sore from the anesthesia or the breathing tube placed in your throat during surgery. If this causes discomfort, gargle with warm salt water. The discomfort should disappear within 24 hours.  If you had a scopolamine patch placed behind your ear for the management of post- operative nausea and/or vomiting:  1. The medication in the patch is effective for 72 hours, after which it should be removed.  Wrap patch in a tissue and discard in the trash. Wash hands thoroughly with soap and water. 2. You may remove the patch earlier than 72 hours if you experience unpleasant side effects which may include dry mouth, dizziness or visual disturbances. 3. Avoid touching the patch. Wash your hands with soap and water after contact with the patch.

## 2020-09-14 NOTE — Op Note (Signed)
Preoperative diagnosis: Chronic cystitis postoperative diagnosis: Chronic interstitial cystitis Surgery: Cystoscopy hydrodistention bladder instillation treatment Surgeon: Dr. Nicki Reaper Cary L[place]  Patient is above diagnosis and consented the above procedure.  Extra care was taken with leg positioning.  Preoperative antibiotics were given  23 French cystoscope utilized.  Bladder mucosa and trigone were normal.  No cystitis.  No carcinoma.  She is hydrodistended 800 mL.  Bladder was emptied.  On reinspection she had diffuse glomerulations that were quite fine but definitely present.  No ulcers.  No bladder injury.  Bladder was emptied  As a separate procedure 15 cc of 0.5% Marcaine + 100 mg of Pyridium similar bladder 14 French red rubber catheter  Patient will be treated as per protocol

## 2020-09-14 NOTE — Transfer of Care (Signed)
Immediate Anesthesia Transfer of Care Note  Patient: Rhonda [name]  Procedure(s) Performed: Procedure(s) (LRB): CYSTOSCOPY/HYDRODISTENSION  INSTILLATION (N/A)  Patient Location: PACU  Anesthesia Type: General  Level of Consciousness: awake, sedated, patient cooperative and responds to stimulation  Airway & Oxygen Therapy: Patient Spontanous Breathing and Patient connected to [place] 02 and soft FM   Post-op Assessment: Report given to PACU RN, Post -op Vital signs reviewed and stable and Patient moving all extremities  Post vital signs: Reviewed and stable  Complications: No apparent anesthesia complications

## 2020-09-14 NOTE — Anesthesia Procedure Notes (Signed)
Procedure Name: LMA Insertion Date/Time: 09/14/2020 9:26 AM Performed by: Justice Rocher, CRNA Pre-anesthesia Checklist: Patient identified, Emergency Drugs available, Suction available and Patient being monitored Patient Re-evaluated:Patient Re-evaluated prior to induction Oxygen Delivery Method: Circle system utilized Preoxygenation: Pre-oxygenation with 100% oxygen Induction Type: IV induction Ventilation: Mask ventilation without difficulty LMA: LMA inserted LMA Size: 4.0 Number of attempts: 1 Placement Confirmation: positive ETCO2 Tube secured with: Tape Dental Injury: Teeth and Oropharynx as per pre-operative assessment

## 2020-09-14 NOTE — Interval H&P Note (Signed)
History and Physical Interval Note:  09/14/2020 9:15 AM  Rhonda [name]  has presented today for surgery, with the diagnosis of PELVIC PAIN.  The various methods of treatment have been discussed with the patient and family. After consideration of risks, benefits and other options for treatment, the patient has consented to  Procedure(s): CYSTOSCOPY/HYDRODISTENSION  INSTILLATION (N/A) as a surgical intervention.  The patient's history has been reviewed, patient examined, no change in status, stable for surgery.  I have reviewed the patient's chart and labs.  Questions were answered to the patient's satisfaction.     Rayn Shorb A Enes Wegener

## 2020-09-15 ENCOUNTER — Encounter (HOSPITAL_BASED_OUTPATIENT_CLINIC_OR_DEPARTMENT_OTHER): Payer: Self-pay | Admitting: Urology

## 2021-09-08 ENCOUNTER — Encounter: Payer: Self-pay | Admitting: *Deleted

## 2021-09-13 ENCOUNTER — Ambulatory Visit (INDEPENDENT_AMBULATORY_CARE_PROVIDER_SITE_OTHER): Payer: 59 | Admitting: Psychiatry

## 2021-09-13 ENCOUNTER — Encounter: Payer: Self-pay | Admitting: Psychiatry

## 2021-09-13 VITALS — BP 124/94 | HR 87 | Ht 68.5 in | Wt 176.4 lb

## 2021-09-13 DIAGNOSIS — G43109 Migraine with aura, not intractable, without status migrainosus: Secondary | ICD-10-CM | POA: Diagnosis not present

## 2021-09-13 MED ORDER — NARATRIPTAN HCL 2.5 MG PO TABS: 2.5000 mg | ORAL_TABLET | ORAL | 0 refills | Status: DC | PRN
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
## 2021-09-13 NOTE — Patient Instructions (Addendum)
Start naratriptan 2.5 mg as needed. Take at the onset of migraine. If headache recurs or does not fully resolve, you may take a second dose after 4 hours. Please avoid taking more than 2 days per week or 10 days per month. Take magnesium 400-500 mg daily for headache prevention  Natural supplements: Magnesium Oxide or Magnesium Glycinate 500 mg at bed (up to 800 mg daily) Coenzyme Q10 300 mg in AM Vitamin B2- 200 mg twice a day  Add 1 supplement at a time since even natural supplements can have undesirable side effects. You can sometimes buy supplements cheaper (especially Coenzyme Q10) at www.https://compton-perez.com/ or at [employer].  Vitamins and herbs that show potential:  Magnesium: Magnesium (250 mg twice a day or 500 mg at bed) has a relaxant effect on smooth muscles such as blood vessels. Individuals suffering from frequent or daily headache usually have low magnesium levels which can be increase with daily supplementation of 400-750 mg. Three trials found 40-90% average headache reduction  when used as a preventative. Magnesium also demonstrated the benefit in menstrually related migraine.  Magnesium is part of the messenger system in the serotonin cascade and it is a good muscle relaxant.  It is also useful for constipation which can be a side effect of other medications used to treat migraine. Good sources include nuts, whole grains, and tomatoes. Side Effects: loose stool/diarrhea Riboflavin (vitamin B 2) 200 mg twice a day. This vitamin assists nerve cells in the production of ATP a principal energy storing molecule.  It is necessary for many chemical reactions in the body.  There have been at least 3 clinical trials of riboflavin using 400 mg per day all of which suggested that migraine frequency can be decreased.  All 3 trials showed significant improvement in over half of migraine sufferers.  The supplement is found in bread, cereal, milk, meat, and poultry.  Most Americans get more riboflavin than the  recommended daily allowance, however riboflavin deficiency is not necessary for the supplements to help prevent headache. Side effects: energizing, green urine  Coenzyme Q10: This is present in almost all cells in the body and is critical component for the conversion of energy.  Recent studies have shown that a nutritional supplement of CoQ10 can reduce the frequency of migraine attacks by improving the energy production of cells as with riboflavin.  Doses of 150 mg twice a day have been shown to be effective.  Melatonin: Increasing evidence shows correlation between melatonin secretion and headache conditions.  Melatonin supplementation has decreased headache intensity and duration.  It is widely used as a sleep aid.  Sleep is natures way of dealing with migraine.  A dose of 3 mg is recommended to start for headaches including cluster headache. Higher doses up to 15 mg has been reviewed for use in Cluster headache and have been used. The rationale behind using melatonin for cluster is that many theories regarding the cause of Cluster headache center around the disruption of the normal circadian rhythm in the brain.  This helps restore the normal circadian rhythm.  Ginger: Ginger has a small amount of antihistamine and anti-inflammatory action which may help headache.  It is primarily used for nausea and may aid in the absorption of other medications. HEADACHE DIET: Foods and beverages which may trigger migraine Note that only 20% of headache patients are food sensitive. You will know if you are food sensitive if you get a headache consistently 20 minutes to 2 hours after eating a certain  food. Only cut out a food if it causes headaches, otherwise you might remove foods you enjoy! What matters most for diet is to eat a well balanced healthy diet full of vegetables and low fat protein, and to not miss meals.  Chocolate, other sweets ALL cheeses except cottage and cream cheese Dairy products, yogurt, sour  cream, ice cream Liver Meat extracts (Bovril, Marmite, meat tenderizers) Meats or fish which have undergone aging, fermenting, pickling or smoking. These include: Hotdogs,salami,Lox,sausage, mortadellas,smoked salmon, pepperoni, Pickled herring Pods of broad bean (English beans, Chinese pea pods, [place] (fava) beans, lima and navy beans Ripe avocado, ripe banana Yeast extracts or active yeast preparations such as Brewer's or Fleishman's (commercial bakes goods are permitted) Tomato based foods, pizza (lasagna, etc.)  MSG (monosodium glutamate) is disguised as many things; look for these common aliases: Monopotassium glutamate Autolysed yeast Hydrolysed protein Sodium caseinate "flavorings" "all natural preservatives" Nutrasweet  Avoid all other foods that convincingly provoke headaches.  Resources: The Dizzy Lu Duffel Your Headache Diet, migrainestrong.com  https://www.aguirre.org/  Caffeine and Migraine For patients that have migraine, caffeine intake more than 3 days per week can lead to dependency and increased migraine frequency. I would recommend cutting back on your caffeine intake as best you can. The recommended amount of caffeine is 200-300 mg daily, although migraine patients may experience dependency at even lower doses. While you may notice an increase in headache temporarily, cutting back will be helpful for headaches in the long run. For more information on caffeine and migraine, visit: https://americanmigrainefoundation.org/resource-library/caffeine-and-migraine/  Headache Prevention Strategies:  1. Maintain a headache diary; learn to identify and avoid triggers.  - This can be a simple note where you log when you had a headache, associated symptoms, and medications used - There are several smartphone apps developed to help track migraines: Migraine Buddy, Migraine Monitor, Curelator N1-Headache App  Common triggers  include: Emotional triggers: Emotional/Upset family or friends Emotional/Upset occupation Business reversal/success Anticipation anxiety Crisis-serious Post-crisis periodNew job/position   Physical triggers: Vacation Day Weekend Strenuous Exercise High Altitude Location New Move Menstrual Day Physical Illness Oversleep/Not enough sleep Weather changes Light: Photophobia or light sesnitivity treatment involves a balance between desensitization and reduction in overly strong input. Use dark polarized glasses outside, but not inside. Avoid bright or fluorescent light, but do not dim environment to the point that going into a normally lit room hurts. Consider FL-41 tint lenses, which reduce the most irritating wavelengths without blocking too much light.  These can be obtained at axonoptics.com or theraspecs.com Foods: see list above.  2. Limit use of acute treatments (over-the-counter medications, triptans, etc.) to no more than 2 days per week or 10 days per month to prevent medication overuse headache (rebound headache).    3. Follow a regular schedule (including weekends and holidays): Don't skip meals. Eat a balanced diet. 8 hours of sleep nightly. Minimize stress. Exercise 30 minutes per day. Being overweight is associated with a 5 times increased risk of chronic migraine. Keep well hydrated and drink 6-8 glasses of water per day.  4. Initiate non-pharmacologic measures at the earliest onset of your headache. Rest and quiet environment. Relax and reduce stress. Breathe2Relax is a free app that can instruct you on    some simple relaxtion and breathing techniques. Http://Dawnbuse.com is a    free website that provides teaching videos on relaxation.  Also, there are  many apps that   can be downloaded for "mindful" relaxation.  An app called YOGA NIDRA will help walk  you through mindfulness. Another app called Calm can be downloaded to give you a structured mindfulness guide with  daily reminders and skill development. Headspace for guided meditation Mindfulness Based Stress Reduction Online Course: www.palousemindfulness.com Cold compresses.  5. Don't wait!! Take the maximum allowable dosage of prescribed medication at the first sign of migraine.  6. Compliance:  Take prescribed medication regularly as directed and at the first sign of a migraine.  7. Communicate:  Call your physician when problems arise, especially if your headaches change, increase in frequency/severity, or become associated with neurological symptoms (weakness, numbness, slurred speech, etc.).  8. Headache/pain management therapies: Consider various complementary methods, including medication, behavioral therapy, psychological counselling, biofeedback, massage therapy, acupuncture, dry needling, and other modalities.  Such measures may reduce the need for medications. Counseling for pain management, where patients learn to function and ignore/minimize their pain, seems to work very well.  9. Recommend changing family's attention and focus away from patient's headaches. Instead, emphasize daily activities. If first question of day is 'How are your headaches/Do you have a headache today?', then patient will constantly think about headaches, thus making them worse. Goal is to re-direct attention away from headaches, toward daily activities and other distractions.  10. Helpful Websites: www.AmericanHeadacheSociety.org VoipObserver.it www.headaches.org GolfingFamily.no www.achenet.org

## 2021-10-10 ENCOUNTER — Other Ambulatory Visit: Payer: Self-pay | Admitting: Psychiatry

## 2022-01-03 ENCOUNTER — Ambulatory Visit (INDEPENDENT_AMBULATORY_CARE_PROVIDER_SITE_OTHER): Payer: 59 | Admitting: Psychiatry

## 2022-01-03 ENCOUNTER — Encounter: Payer: Self-pay | Admitting: Psychiatry

## 2022-01-03 ENCOUNTER — Other Ambulatory Visit: Payer: Self-pay

## 2022-01-03 VITALS — BP 114/69 | HR 74 | Ht 68.5 in | Wt 171.8 lb

## 2022-01-03 DIAGNOSIS — G43909 Migraine, unspecified, not intractable, without status migrainosus: Secondary | ICD-10-CM | POA: Insufficient documentation

## 2022-01-03 DIAGNOSIS — G43109 Migraine with aura, not intractable, without status migrainosus: Secondary | ICD-10-CM | POA: Diagnosis not present

## 2022-01-03 MED ORDER — DICLOFENAC POTASSIUM 50 MG PO TABS: ORAL_TABLET | ORAL | 3 refills | Status: AC

## 2022-01-03 NOTE — Progress Notes (Signed)
° °  CC:  headaches  Follow-up Visit  Last visit: 09/13/21  Brief HPI: 46 year old female who follows in clinic for migraines with aura.  At her last visit she was started on daily magnesium for prevention and naratriptan for rescue.  Interval History: Since her last visit her migraines have decreased in frequency. She did not start magnesium. Tried naratriptan twice which did resolve her headache, but it affected her cognition and she could not tolerate it. Currently she is having one migraine per week. Takes Tylenol which helps take the edge off.  Many of her headaches are in the morning. She does snore, but has not stopped breathing at night and does not fall asleep during the day.  Palpitations have resolved.  08/17/21: CBC and CMP unremarkable  Headache days per month: 4 Headache free days per month: 26  Current Headache Regimen: Preventative: none Abortive: Tylenol  Prior Therapies                                  Tylenol Ibuprofen Naratriptan 2.5 mg PRN  Physical Exam:   Vital Signs: BP 114/69    Pulse 74    Ht 5' 8.5" (1.74 m)    Wt 171 lb 12.8 oz (77.9 kg)    BMI 25.74 kg/m  GENERAL:  well appearing, in no acute distress, alert  SKIN:  Color, texture, turgor normal. No rashes or lesions HEAD:  Normocephalic/atraumatic. RESP: normal respiratory effort MSK:  No gross joint deformities.   NEUROLOGICAL: Mental Status: Alert, oriented to person, place and time, Follows commands, and Speech fluent and appropriate. Cranial Nerves: PERRL, face symmetric, no dysarthria, hearing grossly intact Motor: moves all extremities equally Coordination: finger-nose-finger intact bilaterally Gait: normal-based.  IMPRESSION: 46 year old female who presents for follow up of episodic migraines. She was not able to tolerate naratriptan due to cognitive side effects. Discussed treatment options including alternate triptans or NSAIDs. She is hesitant to try another triptan at this time  due to side effects. Will start diclofenac for migraine rescue. Discussed how snoring and OSA can cause morning headaches. She will have her husband monitor her snoring and let her know if she stops breathing/gasps for air at night.  PLAN: -Rescue: Start diclofenac 50-100 mg PRN -next steps: consider alternate triptan, gepant, consider sleep study if headache frequency worsens  Follow-up: 6 months  I spent a total of 18 minutes on the date of the service. Headache education was done. Discussed treatment options including acute medications. Discussed medication side effects, adverse reactions and drug interactions. Written educational materials and patient instructions outlining all of the above were given.  Genia Harold, MD 01/03/22 9:07 AM

## 2022-01-03 NOTE — Patient Instructions (Signed)
Take diclofenac (cataflam) as needed for migraine

## 2022-07-03 ENCOUNTER — Ambulatory Visit: Payer: 59 | Admitting: Psychiatry

## 2022-07-07 ENCOUNTER — Other Ambulatory Visit: Payer: Self-pay | Admitting: Obstetrics and Gynecology

## 2022-07-07 DIAGNOSIS — N63 Unspecified lump in unspecified breast: Secondary | ICD-10-CM

## 2022-08-08 ENCOUNTER — Ambulatory Visit
Admission: RE | Admit: 2022-08-08 | Discharge: 2022-08-08 | Disposition: A | Discharge status: Home / Self Care | Payer: 59 | Source: Ambulatory Visit | Attending: Obstetrics and Gynecology | Admitting: Obstetrics and Gynecology

## 2022-08-08 DIAGNOSIS — N63 Unspecified lump in unspecified breast: Secondary | ICD-10-CM

## 2023-10-16 ENCOUNTER — Other Ambulatory Visit: Payer: Self-pay | Admitting: Obstetrics and Gynecology

## 2023-10-16 DIAGNOSIS — Z1231 Encounter for screening mammogram for malignant neoplasm of breast: Secondary | ICD-10-CM

## 2023-11-08 ENCOUNTER — Ambulatory Visit
Admission: RE | Admit: 2023-11-08 | Discharge: 2023-11-08 | Disposition: A | Discharge status: Home / Self Care | Payer: 59 | Source: Ambulatory Visit | Attending: Obstetrics and Gynecology | Admitting: Obstetrics and Gynecology

## 2023-11-08 DIAGNOSIS — Z1231 Encounter for screening mammogram for malignant neoplasm of breast: Secondary | ICD-10-CM

## 2023-11-16 ENCOUNTER — Other Ambulatory Visit: Payer: Self-pay | Admitting: Obstetrics and Gynecology

## 2023-11-16 DIAGNOSIS — R928 Other abnormal and inconclusive findings on diagnostic imaging of breast: Secondary | ICD-10-CM

## 2023-12-07 ENCOUNTER — Other Ambulatory Visit: Payer: Self-pay | Admitting: Obstetrics and Gynecology

## 2023-12-07 ENCOUNTER — Ambulatory Visit
Admission: RE | Admit: 2023-12-07 | Discharge: 2023-12-07 | Disposition: A | Discharge status: Home / Self Care | Payer: 59 | Source: Ambulatory Visit | Attending: Obstetrics and Gynecology

## 2023-12-07 DIAGNOSIS — R928 Other abnormal and inconclusive findings on diagnostic imaging of breast: Secondary | ICD-10-CM

## 2023-12-07 DIAGNOSIS — N6001 Solitary cyst of right breast: Secondary | ICD-10-CM

## 2023-12-12 ENCOUNTER — Ambulatory Visit
Admission: RE | Admit: 2023-12-12 | Discharge: 2023-12-12 | Disposition: A | Discharge status: Home / Self Care | Payer: 59 | Source: Ambulatory Visit | Attending: Obstetrics and Gynecology | Admitting: Obstetrics and Gynecology

## 2023-12-12 DIAGNOSIS — N6001 Solitary cyst of right breast: Secondary | ICD-10-CM

## 2024-03-27 ENCOUNTER — Emergency Department (HOSPITAL_COMMUNITY)

## 2024-03-27 ENCOUNTER — Encounter (HOSPITAL_COMMUNITY): Payer: Self-pay

## 2024-03-27 ENCOUNTER — Emergency Department (HOSPITAL_COMMUNITY)
Admission: EM | Admit: 2024-03-27 | Discharge: 2024-03-28 | Disposition: A | Discharge status: Home / Self Care | Attending: Emergency Medicine | Admitting: Emergency Medicine

## 2024-03-27 ENCOUNTER — Other Ambulatory Visit: Payer: Self-pay

## 2024-03-27 DIAGNOSIS — G43009 Migraine without aura, not intractable, without status migrainosus: Secondary | ICD-10-CM | POA: Insufficient documentation

## 2024-03-27 DIAGNOSIS — R42 Dizziness and giddiness: Secondary | ICD-10-CM | POA: Diagnosis present

## 2024-03-27 LAB — URINALYSIS, ROUTINE W REFLEX MICROSCOPIC
Bilirubin Urine: NEGATIVE
Glucose, UA: NEGATIVE mg/dL
Ketones, ur: NEGATIVE mg/dL
Leukocytes,Ua: NEGATIVE
Nitrite: NEGATIVE
Protein, ur: NEGATIVE mg/dL
Specific Gravity, Urine: 1.013 (ref 1.005–1.030)
pH: 6 (ref 5.0–8.0)

## 2024-03-27 LAB — CBC
HCT: 41.4 % (ref 36.0–46.0)
Hemoglobin: 13.1 g/dL (ref 12.0–15.0)
MCH: 27.1 pg (ref 26.0–34.0)
MCHC: 31.6 g/dL (ref 30.0–36.0)
MCV: 85.5 fL (ref 80.0–100.0)
Platelets: 307 10*3/uL (ref 150–400)
RBC: 4.84 MIL/uL (ref 3.87–5.11)
RDW: 15.4 % (ref 11.5–15.5)
WBC: 12.1 10*3/uL — ABNORMAL HIGH (ref 4.0–10.5)
nRBC: 0 % (ref 0.0–0.2)

## 2024-03-27 LAB — BASIC METABOLIC PANEL WITH GFR
Anion gap: 7 (ref 5–15)
BUN: 11 mg/dL (ref 6–20)
CO2: 27 mmol/L (ref 22–32)
Calcium: 9.1 mg/dL (ref 8.9–10.3)
Chloride: 102 mmol/L (ref 98–111)
Creatinine, Ser: 0.96 mg/dL (ref 0.44–1.00)
GFR, Estimated: >60 mL/min (ref >60)
Glucose, Bld: 167 mg/dL — ABNORMAL HIGH (ref 70–99)
Potassium: 3 mmol/L — ABNORMAL LOW (ref 3.5–5.1)
Sodium: 136 mmol/L (ref 135–145)

## 2024-03-27 LAB — HEPATIC FUNCTION PANEL
ALT: 13 U/L (ref 0–44)
AST: 20 U/L (ref 15–41)
Albumin: 3.9 g/dL (ref 3.5–5.0)
Alkaline Phosphatase: 61 U/L (ref 38–126)
Bilirubin, Direct: <0.1 mg/dL (ref 0.0–0.2)
Total Bilirubin: 0.6 mg/dL (ref 0.0–1.2)
Total Protein: 7.3 g/dL (ref 6.5–8.1)

## 2024-03-27 LAB — RAPID URINE DRUG SCREEN, HOSP PERFORMED
Amphetamines: NOT DETECTED
Barbiturates: NOT DETECTED
Benzodiazepines: NOT DETECTED
Cocaine: NOT DETECTED
Opiates: NOT DETECTED
Tetrahydrocannabinol: NOT DETECTED

## 2024-03-27 LAB — TROPONIN I (HIGH SENSITIVITY): Troponin I (High Sensitivity): 3 ng/L (ref <18)

## 2024-03-27 LAB — LIPASE, BLOOD: Lipase: 199 U/L — ABNORMAL HIGH (ref 11–51)

## 2024-03-27 MED ORDER — POTASSIUM CHLORIDE CRYS ER 20 MEQ PO TBCR
60.0000 meq | EXTENDED_RELEASE_TABLET | Freq: Once | ORAL | Status: AC
  Administered 2024-03-27: 60 meq via ORAL
  Filled 2024-03-27: qty 3

## 2024-03-27 MED ORDER — LACTATED RINGERS IV BOLUS
1000.0000 mL | Freq: Once | INTRAVENOUS | Status: AC
  Administered 2024-03-27: 1000 mL via INTRAVENOUS

## 2024-03-27 NOTE — ED Triage Notes (Signed)
 Pt c/o midsternal chest pain x this evening. Pt reports getting really hot and nauseous at dinner tonight, never vomited, but is still c/o being extremely hot and dizzy. Pt denies SOB and LOC.

## 2024-03-27 NOTE — ED Provider Notes (Signed)
 [place] [facility]ERGENCY DEPARTMENT AT [facility] Provider Note   CSN: [id number] Arrival date & time: 03/27/24  2147     History {Add pertinent medical, surgical, social history, OB history to HPI:1} Chief Complaint  Patient presents with   Chest Pain   Dizziness   Hot Flashes    Rhonda [name] is a 48 y.o. female who presents with presyncopal symptoms that started this evening when she was out to dinner with her family.  Patient states that it was a very stressful day and that she was walking significantly more than normal both inside and outdoors.  States that she did not hydrate like normal.  This evening while she was out to supper with her family she began to feel flushed, overheated, lightheaded and began to be tremulous.  She also states that her hands became significantly weak, right greater than left though this was transient.  No history of same.  History of enlarged thyroid  in the past but states that she does not have any diagnosed thyroid  disorders, is not on any thyroid  medications.  The only preceding symptom was a headache that started today with during her workday that was not thunderclap.  No associated vision changes that time but this evening she did have subtle blurry vision now resolved.  Pressure and tightness sensation in the central chest without shortness of breath or palpitations.  Nausea without vomiting.  No urinary or GI symptoms prior to today.  Was feeling well yesterday.  History of hysterectomy in 2004 patient endorses compliance with her blood pressure medication daily. No recent travel or prolonged immobilization.  No history of blood clots, no hormone replacement therapy.  HPI     Home Medications Prior to Admission medications   Medication Sig Start Date End Date Taking? Authorizing Provider  acetaminophen  (TYLENOL ) 500 MG tablet Take 500 mg by mouth every 6 (six) hours as needed.    [provider]  diclofenac  (CATAFLAM ) 50 MG  tablet Take 1-2 pills as needed at the first sign of migraine 01/03/22   Dala Dublin, MD  diphenhydrAMINE HCl (BENADRYL ALLERGY PO) Take by mouth as needed.    [provider]  Multiple Vitamins-Minerals (MULTIVITAMIN WOMEN PO) Take by mouth daily.    [provider]  naratriptan  (AMERGE) 2.5 MG tablet TAKE 1 TABLET BY MOUTH AS NEEDED FOR MIGRAINE. TAKE ONE (1) TABLET AT ONSET OF HEADACHE IF RETURNS OR DOES NOT RESOLVE, MAY REPEAT AFTER 4 HOURS DO NOT EXCEED FIVE (5) MG IN 24 HOURS. 10/11/21   Dala Dublin, MD  UNABLE TO FIND Med Name: sea moss    [provider]      Allergies    Naproxen    Review of Systems   Review of Systems  Constitutional:  Positive for diaphoresis.  Eyes:  Positive for visual disturbance. Negative for photophobia.  Respiratory:  Positive for chest tightness.   Cardiovascular:  Positive for chest pain.  Gastrointestinal:  Positive for nausea. Negative for vomiting.  Neurological:  Positive for tremors, weakness, light-headedness and numbness. Negative for seizures, syncope and speech difficulty.    Physical Exam Updated Vital Signs BP (!) 157/100   Pulse 82   Temp 97.9 F (36.6 C) (Oral)   Resp 13   Ht 5\' 8"  (1.727 m)   Wt 80.3 kg   SpO2 100%   BMI 26.91 kg/m  Physical Exam Vitals and nursing note reviewed.  Constitutional:      Appearance: She is not ill-appearing or  toxic-appearing.  HENT:     Head: Normocephalic and atraumatic.     Mouth/Throat:     Mouth: Mucous membranes are moist.     Pharynx: No oropharyngeal exudate or posterior oropharyngeal erythema.  Eyes:     General: Lids are normal. Vision grossly intact.        Right eye: No discharge.        Left eye: No discharge.     Extraocular Movements: Extraocular movements intact.     Conjunctiva/sclera: Conjunctivae normal.     Pupils: Pupils are equal, round, and reactive to light.     Visual Fields: Right eye visual fields normal and left eye visual  fields normal.  Cardiovascular:     Rate and Rhythm: Normal rate and regular rhythm.     Pulses: Normal pulses.     Heart sounds: Normal heart sounds. No murmur heard. Pulmonary:     Effort: Pulmonary effort is normal. No respiratory distress.     Breath sounds: Normal breath sounds. No wheezing or rales.  Chest:     Chest wall: No mass, tenderness or edema.  Abdominal:     General: Bowel sounds are normal. There is no distension.     Tenderness: There is no abdominal tenderness.  Musculoskeletal:        General: No deformity.     Cervical back: Neck supple.     Right lower leg: No tenderness. No edema.     Left lower leg: No tenderness. No edema.  Skin:    General: Skin is warm and dry.     Capillary Refill: Capillary refill takes less than 2 seconds.  Neurological:     General: No focal deficit present.     Mental Status: She is alert and oriented to person, place, and time.     GCS: GCS eye subscore is 4. GCS verbal subscore is 5. GCS motor subscore is 6.     Cranial Nerves: Cranial nerves 2-12 are intact. No dysarthria.     Sensory: Sensation is intact.     Motor: Tremor present. No weakness or pronator drift.     Coordination: Finger-Nose-Finger Test normal.     Comments: Patient is alert and oriented x 4, answering questions appropriately though her comprehension appears delayed.  Bilateral upper extremity R>L and chin tremor, right lower extremity intention tremor, per family new for this patient.  Romberg and gait not tested for patient safety. Symmetric strength in upper and lower extremities, submit drug strong pulses in upper and lower extremities bilaterally.  Psychiatric:        Mood and Affect: Mood normal.     ED Results / Procedures / Treatments   Labs (all labs ordered are listed, but only abnormal results are displayed) Labs Reviewed  BASIC METABOLIC PANEL WITH GFR - Abnormal; Notable for the following components:      Result Value   Potassium 3.0 (*)     Glucose, Bld 167 (*)    All other components within normal limits  CBC - Abnormal; Notable for the following components:   WBC 12.1 (*)    All other components within normal limits  URINALYSIS, ROUTINE W REFLEX MICROSCOPIC - Abnormal; Notable for the following components:   Hgb urine dipstick MODERATE (*)    Bacteria, UA FEW (*)    All other components within normal limits  HEPATIC FUNCTION PANEL  LIPASE, BLOOD  TSH  RAPID URINE DRUG SCREEN, HOSP PERFORMED  ETHANOL  TROPONIN I (HIGH SENSITIVITY)  EKG EKG Interpretation Date/Time:  Thursday Mar 27 2024 21:54:36 EDT Ventricular Rate:  78 PR Interval:  157 QRS Duration:  86 QT Interval:  389 QTC Calculation: 444 R Axis:   61  Text Interpretation: Sinus rhythm LAE, consider biatrial enlargement Confirmed by Abner Hoffman [phone]) on 03/27/2024 10:33:36 PM  Radiology DG Chest 2 View Result Date: 03/27/2024 CLINICAL DATA:  Chest pain EXAM: CHEST - 2 VIEW COMPARISON:  08/25/2013 FINDINGS: The heart size and mediastinal contours are within normal limits. Both lungs are clear. The visualized skeletal structures are unremarkable. IMPRESSION: No active cardiopulmonary disease. Electronically Signed   By: Esmeralda Hedge M.D.   On: 03/27/2024 22:50    Procedures Procedures  {Document cardiac monitor, telemetry assessment procedure when appropriate:1}  Medications Ordered in ED Medications - No data to display  ED Course/ Medical Decision Making/ A&P   {   Click here for ABCD2, HEART and other calculatorsREFRESH Note before signing :1}                              Medical Decision Making 48 year old female with confusion, diaphoresis, and new tremor.  Hypertensive at 90, vital signs otherwise normal.  Cardiopulmonary abdominal cinromide.  GCS of 15.  Neurologic exam as above with new tremor.  Delayed comprehension.  The differential diagnosis for AMS is extensive and includes, but is not limited to:  Drug overdose - opioids,  alcohol, sedatives, antipsychotics, drug withdrawal, others Metabolic: hypoxia, hypoglycemia, hyperglycemia, hypercalcemia, hypernatremia, hyponatremia, uremia, hepatic encephalopathy, hypothyroidism, hyperthyroidism, vitamin B12 or thiamine deficiency, carbon monoxide poisoning, Wilson's disease, Lactic acidosis, DKA/HHOS Infectious: meningitis, encephalitis, bacteremia/sepsis, urinary tract infection, pneumonia, neurosyphilis Structural: Space-occupying lesion, (brain tumor, subdural hematoma, hydrocephalus,) Vascular: stroke, subarachnoid hemorrhage, coronary ischemia, hypertensive encephalopathy, CNS vasculitis, thrombotic thrombocytopenic purpura, disseminated intravascular coagulation, hyperviscosity Psychiatric: Schizophrenia, depression; Other: Seizure, hypothermia, heat stroke, ICU psychosis, dementia -"sundowning."   Amount and/or Complexity of Data Reviewed Labs: ordered.    Details: CBC with leukocytosis of 12, BMP with hypokalemia of 3, normal creatinine.  UA with moderate hemoglobin, few bacteria otherwise unremarkable.  Lipase mildly elevated to 199.  Hepatic function panel is normal.  Troponin is negative.  TSH pending,  Radiology: ordered.    Details: chest x-ray negative for acute cardiopulmonary disease.   ECG/medicine tests:     Details: EKG with sinus rhythm without ischemic changes.  Risk Prescription drug management.   ***  {Document critical care time when appropriate:1} {Document review of labs and clinical decision tools ie heart score, Chads2Vasc2 etc:1}  {Document your independent review of radiology images, and any outside records:1} {Document your discussion with family members, caretakers, and with consultants:1} {Document social determinants of health affecting pt's care:1} {Document your decision making why or why not admission, treatments were needed:1} Final Clinical Impression(s) / ED Diagnoses Final diagnoses:  None    Rx / DC Orders ED Discharge  Orders     None

## 2024-03-28 LAB — TSH: TSH: 1.019 u[IU]/mL (ref 0.350–4.500)

## 2024-03-28 LAB — ETHANOL: Alcohol, Ethyl (B): <15 mg/dL (ref <15)

## 2024-03-28 MED ORDER — METOCLOPRAMIDE HCL 5 MG/ML IJ SOLN
10.0000 mg | Freq: Once | INTRAMUSCULAR | Status: AC
  Administered 2024-03-28: 10 mg via INTRAVENOUS
  Filled 2024-03-28: qty 2

## 2024-03-28 MED ORDER — BUTALBITAL-APAP-CAFFEINE 50-325-40 MG PO TABS
1.0000 | ORAL_TABLET | Freq: Once | ORAL | Status: AC
  Administered 2024-03-28: 1 via ORAL
  Filled 2024-03-28: qty 1

## 2024-03-28 NOTE — Discharge Instructions (Addendum)
 You are seen in the ER today for your tremor, lightheadedness, and headache.  Your workup was very reassuring.  You are likely experiencing a complex migraine.  You may continue to take Tylenol  and ibuprofen as needed for your headache, you can also take Excedrin Migraine at home. Please follow up with your primary neurologist and return to the ER with any new severe symptoms.
